# Patient Record
Sex: Female | Born: 1955 | Race: White | Hispanic: No | Marital: Married | Smoking: Former smoker
Health system: Southern US, Community
[De-identification: ages and names within clinical notes are randomized; demographics above are authoritative.]

## PROBLEM LIST (undated history)

## (undated) DIAGNOSIS — Z789 Other specified health status: Secondary | ICD-10-CM

## (undated) DIAGNOSIS — Z87442 Personal history of urinary calculi: Secondary | ICD-10-CM

## (undated) DIAGNOSIS — Z9889 Other specified postprocedural states: Secondary | ICD-10-CM

## (undated) DIAGNOSIS — J45909 Unspecified asthma, uncomplicated: Secondary | ICD-10-CM

## (undated) DIAGNOSIS — I10 Essential (primary) hypertension: Secondary | ICD-10-CM

## (undated) DIAGNOSIS — M1990 Unspecified osteoarthritis, unspecified site: Secondary | ICD-10-CM

## (undated) DIAGNOSIS — Z8489 Family history of other specified conditions: Secondary | ICD-10-CM

## (undated) DIAGNOSIS — R112 Nausea with vomiting, unspecified: Secondary | ICD-10-CM

## (undated) HISTORY — PX: KNEE ARTHROSCOPY: SUR90

## (undated) HISTORY — PX: ENDOMETRIAL ABLATION: SHX621

## (undated) HISTORY — PX: TONSILLECTOMY: SUR1361

## (undated) HISTORY — PX: CARPAL TUNNEL RELEASE: SHX101

## (undated) HISTORY — PX: ANKLE FRACTURE SURGERY: SHX122

## (undated) HISTORY — PX: OTHER SURGICAL HISTORY: SHX169

---

## 1999-11-15 ENCOUNTER — Other Ambulatory Visit: Admission: RE | Admit: 1999-11-15 | Discharge: 1999-11-15 | Payer: Self-pay | Admitting: Gynecology

## 1999-11-15 ENCOUNTER — Encounter: Admission: RE | Admit: 1999-11-15 | Discharge: 1999-11-15 | Payer: Self-pay | Admitting: Gynecology

## 1999-11-15 ENCOUNTER — Encounter: Payer: Self-pay | Admitting: Gynecology

## 2000-11-01 ENCOUNTER — Other Ambulatory Visit: Admission: RE | Admit: 2000-11-01 | Discharge: 2000-11-01 | Payer: Self-pay | Admitting: Gynecology

## 2000-11-15 ENCOUNTER — Encounter: Admission: RE | Admit: 2000-11-15 | Discharge: 2000-11-15 | Payer: Self-pay | Admitting: Gynecology

## 2000-11-15 ENCOUNTER — Encounter: Payer: Self-pay | Admitting: Gynecology

## 2001-11-19 ENCOUNTER — Encounter: Payer: Self-pay | Admitting: Gynecology

## 2001-11-19 ENCOUNTER — Encounter: Admission: RE | Admit: 2001-11-19 | Discharge: 2001-11-19 | Payer: Self-pay | Admitting: Gynecology

## 2001-11-19 ENCOUNTER — Other Ambulatory Visit: Admission: RE | Admit: 2001-11-19 | Discharge: 2001-11-19 | Payer: Self-pay | Admitting: Gynecology

## 2002-11-21 ENCOUNTER — Encounter: Admission: RE | Admit: 2002-11-21 | Discharge: 2002-11-21 | Payer: Self-pay | Admitting: Gynecology

## 2002-11-21 ENCOUNTER — Other Ambulatory Visit: Admission: RE | Admit: 2002-11-21 | Discharge: 2002-11-21 | Payer: Self-pay | Admitting: Gynecology

## 2002-11-21 ENCOUNTER — Encounter: Payer: Self-pay | Admitting: Gynecology

## 2003-11-26 ENCOUNTER — Other Ambulatory Visit: Admission: RE | Admit: 2003-11-26 | Discharge: 2003-11-26 | Payer: Self-pay | Admitting: Gynecology

## 2003-11-26 ENCOUNTER — Encounter: Admission: RE | Admit: 2003-11-26 | Discharge: 2003-11-26 | Payer: Self-pay | Admitting: Gynecology

## 2004-12-01 ENCOUNTER — Other Ambulatory Visit: Admission: RE | Admit: 2004-12-01 | Discharge: 2004-12-01 | Payer: Self-pay | Admitting: Gynecology

## 2004-12-01 ENCOUNTER — Encounter: Admission: RE | Admit: 2004-12-01 | Discharge: 2004-12-01 | Payer: Self-pay | Admitting: Gynecology

## 2006-01-27 ENCOUNTER — Other Ambulatory Visit: Admission: RE | Admit: 2006-01-27 | Discharge: 2006-01-27 | Payer: Self-pay | Admitting: Gynecology

## 2006-01-27 ENCOUNTER — Encounter: Admission: RE | Admit: 2006-01-27 | Discharge: 2006-01-27 | Payer: Self-pay | Admitting: Gynecology

## 2007-01-31 ENCOUNTER — Other Ambulatory Visit: Admission: RE | Admit: 2007-01-31 | Discharge: 2007-01-31 | Payer: Self-pay | Admitting: Gynecology

## 2007-01-31 ENCOUNTER — Encounter: Admission: RE | Admit: 2007-01-31 | Discharge: 2007-01-31 | Payer: Self-pay | Admitting: Gynecology

## 2008-04-21 ENCOUNTER — Encounter: Admission: RE | Admit: 2008-04-21 | Discharge: 2008-04-21 | Payer: Self-pay | Admitting: Gynecology

## 2009-04-22 ENCOUNTER — Encounter: Admission: RE | Admit: 2009-04-22 | Discharge: 2009-04-22 | Payer: Self-pay | Admitting: Gynecology

## 2009-08-01 HISTORY — PX: FRACTURE SURGERY: SHX138

## 2010-04-27 ENCOUNTER — Encounter: Admission: RE | Admit: 2010-04-27 | Discharge: 2010-04-27 | Payer: Self-pay | Admitting: Gynecology

## 2011-03-23 ENCOUNTER — Other Ambulatory Visit: Payer: Self-pay | Admitting: Gynecology

## 2011-03-23 DIAGNOSIS — Z1231 Encounter for screening mammogram for malignant neoplasm of breast: Secondary | ICD-10-CM

## 2011-05-02 ENCOUNTER — Ambulatory Visit
Admission: RE | Admit: 2011-05-02 | Discharge: 2011-05-02 | Disposition: A | Discharge status: Home / Self Care | Payer: BC Managed Care – PPO | Source: Ambulatory Visit | Attending: Gynecology | Admitting: Gynecology

## 2011-05-02 DIAGNOSIS — Z1231 Encounter for screening mammogram for malignant neoplasm of breast: Secondary | ICD-10-CM

## 2011-05-11 DIAGNOSIS — R072 Precordial pain: Secondary | ICD-10-CM

## 2012-04-11 ENCOUNTER — Other Ambulatory Visit: Payer: Self-pay | Admitting: Gynecology

## 2012-04-11 DIAGNOSIS — Z1231 Encounter for screening mammogram for malignant neoplasm of breast: Secondary | ICD-10-CM

## 2012-05-02 ENCOUNTER — Ambulatory Visit
Admission: RE | Admit: 2012-05-02 | Discharge: 2012-05-02 | Disposition: A | Discharge status: Home / Self Care | Payer: BC Managed Care – PPO | Source: Ambulatory Visit | Attending: Gynecology | Admitting: Gynecology

## 2012-05-02 DIAGNOSIS — Z1231 Encounter for screening mammogram for malignant neoplasm of breast: Secondary | ICD-10-CM

## 2013-04-30 ENCOUNTER — Other Ambulatory Visit: Payer: Self-pay

## 2013-04-30 DIAGNOSIS — Z1231 Encounter for screening mammogram for malignant neoplasm of breast: Secondary | ICD-10-CM

## 2013-05-14 ENCOUNTER — Encounter (HOSPITAL_COMMUNITY): Payer: Self-pay

## 2013-05-16 ENCOUNTER — Ambulatory Visit
Admission: RE | Admit: 2013-05-16 | Discharge: 2013-05-16 | Disposition: A | Discharge status: Home / Self Care | Payer: BC Managed Care – PPO | Source: Ambulatory Visit

## 2013-05-16 DIAGNOSIS — Z1231 Encounter for screening mammogram for malignant neoplasm of breast: Secondary | ICD-10-CM

## 2013-05-22 ENCOUNTER — Encounter (HOSPITAL_COMMUNITY)
Admission: RE | Admit: 2013-05-22 | Discharge: 2013-05-22 | Disposition: A | Discharge status: Home / Self Care | Payer: BC Managed Care – PPO | Source: Ambulatory Visit | Attending: Obstetrics and Gynecology | Admitting: Obstetrics and Gynecology

## 2013-05-22 ENCOUNTER — Encounter (HOSPITAL_COMMUNITY): Payer: Self-pay

## 2013-05-22 DIAGNOSIS — Z01812 Encounter for preprocedural laboratory examination: Secondary | ICD-10-CM | POA: Insufficient documentation

## 2013-05-22 HISTORY — DX: Nausea with vomiting, unspecified: R11.2

## 2013-05-22 HISTORY — DX: Other specified postprocedural states: Z98.890

## 2013-05-22 HISTORY — DX: Other specified health status: Z78.9

## 2013-05-22 LAB — COMPREHENSIVE METABOLIC PANEL
ALT: 25 U/L (ref 0–35)
Albumin: 3.8 g/dL (ref 3.5–5.2)
Alkaline Phosphatase: 81 U/L (ref 39–117)
Chloride: 104 mEq/L (ref 96–112)
GFR calc Af Amer: >90 mL/min (ref >90)
Glucose, Bld: 79 mg/dL (ref 70–99)
Potassium: 4.2 mEq/L (ref 3.5–5.1)
Sodium: 139 mEq/L (ref 135–145)
Total Protein: 6.8 g/dL (ref 6.0–8.3)

## 2013-05-22 LAB — CBC
Hemoglobin: 13.9 g/dL (ref 12.0–15.0)
MCHC: 33.7 g/dL (ref 30.0–36.0)
WBC: 4.5 10*3/uL (ref 4.0–10.5)

## 2013-05-22 NOTE — Patient Instructions (Signed)
19 Denise Steele  05/22/2013   Your procedure is scheduled on:  05/30/13  Enter through the Main Entrance of [facility] at 6 AM.  Pick up the phone at the desk and dial [phone].   Call this number if you have problems the morning of surgery: [phone]   Remember:   Do not eat food:After Midnight.  Do not drink clear liquids: After Midnight.  Take these medicines the morning of surgery with A SIP OF WATER: NA   Do not wear jewelry, make-up or nail polish.  Do not wear lotions, powders, or perfumes. You may wear deodorant.  Do not shave 48 hours prior to surgery.  Do not bring valuables to the hospital.  [facility] is not   responsible for any belongings or valuables brought to the hospital.  Contacts, dentures or bridgework may not be worn into surgery.  Leave suitcase in the car. After surgery it may be brought to your room.  For patients admitted to the hospital, checkout time is 11:00 AM the day of              discharge.   Patients discharged the day of surgery will not be allowed to drive             home.  Name and phone number of your driver: NA  Special Instructions:   Shower using CHG 2 nights before surgery and the night before surgery.  If you shower the day of surgery use CHG.  Use special wash - you have one bottle of CHG for all showers.  You should use approximately 1/3 of the bottle for each shower.   Please read over the following fact sheets that you were given:   Surgical Site Infection Prevention

## 2013-05-23 ENCOUNTER — Other Ambulatory Visit (HOSPITAL_COMMUNITY): Payer: Self-pay

## 2013-05-24 ENCOUNTER — Ambulatory Visit: Payer: Self-pay

## 2013-05-29 MED ORDER — GENTAMICIN SULFATE 40 MG/ML IJ SOLN
INTRAVENOUS | Status: AC
  Administered 2013-05-30: 100 mL via INTRAVENOUS
  Filled 2013-05-29: qty 8.75

## 2013-05-29 NOTE — H&P (Signed)
Denise Steele is an 57 y.o. female G1P1 who presents for a scheduled LAVH/BSO due to an ongoing issue with postmenopausal bleeding.  The patient was amenorrheic for 18 months then in 6/14 had a ten day heavy bleed with clots.  She had a benign EMB in 7/14 and an [place] which showed multiple small fibroids.  The endometrium is hard to assess due to a prior endometrial ablation/resection which did make her cycles much lighter until they stopped.  She uses Femring and cyclic progesterones, but does not tolerate provera well.  SHe had another bleeding episode in 8/14 and wishes definitive surgical therapy with concurrent removal of ovaries.    Pertinent Gynecological History: Last pap: normal Date: 2013 OB History: NSVD x1   Menstrual History:  No LMP recorded. Patient is postmenopausal.    Past Medical History  Diagnosis Date  . PONV (postoperative nausea and vomiting)   . Medical history non-contributory     Past Surgical History  Procedure Laterality Date  . Ankle fracture surgery    . Tonsillectomy    . Endometrial ablation      No family history on file.  Social History:  reports that she has quit smoking. She does not have any smokeless tobacco history on file. She reports that she drinks alcohol. She reports that she does not use illicit drugs.  Allergies:  Allergies  Allergen Reactions  . Penicillins Hives    Childhood rxn    No prescriptions prior to admission    ROS  Height 5\' 6"  (1.676 m), weight 83.915 kg (185 lb). Physical Exam  Constitutional: She is oriented to person, place, and time. She appears well-developed and well-nourished.  Cardiovascular: Normal rate and regular rhythm.   Respiratory: Effort normal and breath sounds normal.  GI: Soft.  Genitourinary: Vagina normal.  Uterus ULN size, irregular contour  Musculoskeletal: Normal range of motion.  Neurological: She is alert and oriented to person, place, and time.  Psychiatric: She has a normal mood and  affect. Her behavior is normal.    No results found for this or any previous visit (from the past 24 hour(s)).  No results found.  Assessment/Plan: The patient was counseled regarding the risks of laparoscopic assisted vaginal hysterectomy, The procedure was reviewed in detail and expectations regarding recovery. Risks of bleeding, infection and possible damage to bowel and bladder were reviewed. The patient understands that should a complication arise she would likely need a larger abdominal incision and that this would delay her recovery. She would accept a blood transfusion if needed. We also discussed removal of the fallopian tubes as a means of possibly reducing future risk of ovarian cancer and she is agreeable to this. She will also have her ovaries removed. She is ready to proceed.   Oliver Pila 05/29/2013, 9:30 PM

## 2013-05-30 ENCOUNTER — Ambulatory Visit (HOSPITAL_COMMUNITY): Payer: BC Managed Care – PPO | Admitting: Anesthesiology

## 2013-05-30 ENCOUNTER — Encounter (HOSPITAL_COMMUNITY)
Admission: RE | Disposition: A | Discharge status: Home / Self Care | Payer: Self-pay | Source: Ambulatory Visit | Attending: Obstetrics and Gynecology

## 2013-05-30 ENCOUNTER — Ambulatory Visit (HOSPITAL_COMMUNITY)
Admission: RE | Admit: 2013-05-30 | Discharge: 2013-05-31 | Disposition: A | Discharge status: Home / Self Care | Payer: BC Managed Care – PPO | Source: Ambulatory Visit | Attending: Obstetrics and Gynecology | Admitting: Obstetrics and Gynecology

## 2013-05-30 ENCOUNTER — Encounter (HOSPITAL_COMMUNITY): Payer: Self-pay | Admitting: *Deleted

## 2013-05-30 ENCOUNTER — Encounter (HOSPITAL_COMMUNITY): Payer: BC Managed Care – PPO | Admitting: Anesthesiology

## 2013-05-30 DIAGNOSIS — N9489 Other specified conditions associated with female genital organs and menstrual cycle: Secondary | ICD-10-CM | POA: Insufficient documentation

## 2013-05-30 DIAGNOSIS — D279 Benign neoplasm of unspecified ovary: Secondary | ICD-10-CM | POA: Insufficient documentation

## 2013-05-30 DIAGNOSIS — N950 Postmenopausal bleeding: Secondary | ICD-10-CM | POA: Insufficient documentation

## 2013-05-30 DIAGNOSIS — N72 Inflammatory disease of cervix uteri: Secondary | ICD-10-CM | POA: Insufficient documentation

## 2013-05-30 DIAGNOSIS — D251 Intramural leiomyoma of uterus: Secondary | ICD-10-CM | POA: Insufficient documentation

## 2013-05-30 DIAGNOSIS — Z90710 Acquired absence of both cervix and uterus: Secondary | ICD-10-CM

## 2013-05-30 HISTORY — PX: SALPINGOOPHORECTOMY: SHX82

## 2013-05-30 HISTORY — PX: CYSTOSCOPY: SHX5120

## 2013-05-30 HISTORY — PX: LAPAROSCOPIC ASSISTED VAGINAL HYSTERECTOMY: SHX5398

## 2013-05-30 LAB — TYPE AND SCREEN: ABO/RH(D): AB POS

## 2013-05-30 SURGERY — HYSTERECTOMY, VAGINAL, LAPAROSCOPY-ASSISTED
Anesthesia: General | Site: Vagina | Wound class: Clean Contaminated

## 2013-05-30 MED ORDER — PROPOFOL 10 MG/ML IV BOLUS
INTRAVENOUS | Status: DC | PRN
  Administered 2013-05-30: 200 mg via INTRAVENOUS

## 2013-05-30 MED ORDER — DIPHENHYDRAMINE HCL 50 MG/ML IJ SOLN: 12.5000 mg | Freq: Four times a day (QID) | INTRAMUSCULAR | Status: DC | PRN

## 2013-05-30 MED ORDER — LACTATED RINGERS IV SOLN: INTRAVENOUS | Status: DC

## 2013-05-30 MED ORDER — OXYCODONE-ACETAMINOPHEN 5-325 MG PO TABS
1.0000 | ORAL_TABLET | ORAL | Status: DC | PRN
  Administered 2013-05-31: 2 via ORAL
  Filled 2013-05-30: qty 2

## 2013-05-30 MED ORDER — NEOSTIGMINE METHYLSULFATE 1 MG/ML IJ SOLN
INTRAMUSCULAR | Status: AC
  Filled 2013-05-30: qty 1

## 2013-05-30 MED ORDER — DIPHENHYDRAMINE HCL 12.5 MG/5ML PO ELIX
12.5000 mg | ORAL_SOLUTION | Freq: Four times a day (QID) | ORAL | Status: DC | PRN
  Filled 2013-05-30: qty 5

## 2013-05-30 MED ORDER — MIDAZOLAM HCL 5 MG/5ML IJ SOLN
INTRAMUSCULAR | Status: DC | PRN
  Administered 2013-05-30: 2 mg via INTRAVENOUS

## 2013-05-30 MED ORDER — PROMETHAZINE HCL 25 MG/ML IJ SOLN
6.2500 mg | INTRAMUSCULAR | Status: DC | PRN
  Administered 2013-05-30: 12.5 mg via INTRAVENOUS

## 2013-05-30 MED ORDER — LACTATED RINGERS IV SOLN
INTRAVENOUS | Status: DC
  Administered 2013-05-30 (×3): via INTRAVENOUS
  Administered 2013-05-30: 125 mL/h via INTRAVENOUS

## 2013-05-30 MED ORDER — PROPOFOL 10 MG/ML IV EMUL
INTRAVENOUS | Status: AC
  Filled 2013-05-30: qty 20

## 2013-05-30 MED ORDER — HYDROMORPHONE 0.3 MG/ML IV SOLN
INTRAVENOUS | Status: DC
  Administered 2013-05-30: 7.33 mg via INTRAVENOUS
  Administered 2013-05-30: 2.52 mg via INTRAVENOUS
  Administered 2013-05-30: 14:00:00 via INTRAVENOUS
  Administered 2013-05-31: 1.19 mg via INTRAVENOUS
  Administered 2013-05-31: 1.59 mg via INTRAVENOUS
  Filled 2013-05-30: qty 25

## 2013-05-30 MED ORDER — KETOROLAC TROMETHAMINE 30 MG/ML IJ SOLN: 15.0000 mg | Freq: Once | INTRAMUSCULAR | Status: DC | PRN

## 2013-05-30 MED ORDER — BUPIVACAINE HCL (PF) 0.25 % IJ SOLN
INTRAMUSCULAR | Status: AC
  Filled 2013-05-30: qty 30

## 2013-05-30 MED ORDER — ONDANSETRON HCL 4 MG/2ML IJ SOLN: 4.0000 mg | Freq: Four times a day (QID) | INTRAMUSCULAR | Status: DC | PRN

## 2013-05-30 MED ORDER — FENTANYL CITRATE 0.05 MG/ML IJ SOLN
25.0000 ug | INTRAMUSCULAR | Status: DC | PRN
  Administered 2013-05-30 (×2): 50 ug via INTRAVENOUS

## 2013-05-30 MED ORDER — NEOSTIGMINE METHYLSULFATE 1 MG/ML IJ SOLN
INTRAMUSCULAR | Status: DC | PRN
  Administered 2013-05-30: 3 mg via INTRAVENOUS

## 2013-05-30 MED ORDER — KETOROLAC TROMETHAMINE 30 MG/ML IJ SOLN
INTRAMUSCULAR | Status: DC | PRN
  Administered 2013-05-30: 30 mg via INTRAVENOUS

## 2013-05-30 MED ORDER — LIDOCAINE HCL (CARDIAC) 20 MG/ML IV SOLN
INTRAVENOUS | Status: AC
  Filled 2013-05-30: qty 5

## 2013-05-30 MED ORDER — HYDROMORPHONE HCL PF 1 MG/ML IJ SOLN
INTRAMUSCULAR | Status: DC | PRN
  Administered 2013-05-30: 1 mg via INTRAVENOUS

## 2013-05-30 MED ORDER — GLYCOPYRROLATE 0.2 MG/ML IJ SOLN
INTRAMUSCULAR | Status: DC | PRN
  Administered 2013-05-30: 0.4 mg via INTRAVENOUS

## 2013-05-30 MED ORDER — DEXAMETHASONE SODIUM PHOSPHATE 10 MG/ML IJ SOLN
INTRAMUSCULAR | Status: AC
  Filled 2013-05-30: qty 1

## 2013-05-30 MED ORDER — FENTANYL CITRATE 0.05 MG/ML IJ SOLN
INTRAMUSCULAR | Status: AC
  Filled 2013-05-30: qty 5

## 2013-05-30 MED ORDER — LIDOCAINE HCL (CARDIAC) 20 MG/ML IV SOLN
INTRAVENOUS | Status: DC | PRN
  Administered 2013-05-30: 50 mg via INTRAVENOUS

## 2013-05-30 MED ORDER — DOCUSATE SODIUM 100 MG PO CAPS
100.0000 mg | ORAL_CAPSULE | Freq: Two times a day (BID) | ORAL | Status: DC
  Administered 2013-05-30 – 2013-05-31 (×2): 100 mg via ORAL
  Filled 2013-05-30 (×6): qty 1

## 2013-05-30 MED ORDER — ONDANSETRON HCL 4 MG/2ML IJ SOLN
INTRAMUSCULAR | Status: AC
  Filled 2013-05-30: qty 2

## 2013-05-30 MED ORDER — INDIGOTINDISULFONATE SODIUM 8 MG/ML IJ SOLN
INTRAMUSCULAR | Status: AC
  Filled 2013-05-30: qty 5

## 2013-05-30 MED ORDER — MIDAZOLAM HCL 2 MG/2ML IJ SOLN
INTRAMUSCULAR | Status: AC
  Filled 2013-05-30: qty 2

## 2013-05-30 MED ORDER — PROMETHAZINE HCL 25 MG/ML IJ SOLN
INTRAMUSCULAR | Status: AC
  Filled 2013-05-30: qty 1

## 2013-05-30 MED ORDER — FENTANYL CITRATE 0.05 MG/ML IJ SOLN
INTRAMUSCULAR | Status: AC
  Administered 2013-05-30: 50 ug via INTRAVENOUS
  Filled 2013-05-30: qty 2

## 2013-05-30 MED ORDER — ROCURONIUM BROMIDE 100 MG/10ML IV SOLN
INTRAVENOUS | Status: AC
  Filled 2013-05-30: qty 1

## 2013-05-30 MED ORDER — MEPERIDINE HCL 25 MG/ML IJ SOLN: 6.2500 mg | INTRAMUSCULAR | Status: DC | PRN

## 2013-05-30 MED ORDER — FENTANYL CITRATE 0.05 MG/ML IJ SOLN
INTRAMUSCULAR | Status: DC | PRN
  Administered 2013-05-30 (×7): 50 ug via INTRAVENOUS
  Administered 2013-05-30: 150 ug via INTRAVENOUS

## 2013-05-30 MED ORDER — IBUPROFEN 600 MG PO TABS
600.0000 mg | ORAL_TABLET | Freq: Four times a day (QID) | ORAL | Status: DC | PRN
  Administered 2013-05-31: 600 mg via ORAL
  Filled 2013-05-30: qty 1

## 2013-05-30 MED ORDER — SODIUM CHLORIDE 0.9 % IJ SOLN
INTRAMUSCULAR | Status: AC
  Filled 2013-05-30: qty 10

## 2013-05-30 MED ORDER — VASOPRESSIN 20 UNIT/ML IJ SOLN
INTRAMUSCULAR | Status: AC
  Filled 2013-05-30: qty 1

## 2013-05-30 MED ORDER — NALOXONE HCL 0.4 MG/ML IJ SOLN: 0.4000 mg | INTRAMUSCULAR | Status: DC | PRN

## 2013-05-30 MED ORDER — DEXAMETHASONE SODIUM PHOSPHATE 10 MG/ML IJ SOLN
INTRAMUSCULAR | Status: DC | PRN
  Administered 2013-05-30: 10 mg via INTRAVENOUS

## 2013-05-30 MED ORDER — SODIUM CHLORIDE 0.9 % IJ SOLN: 9.0000 mL | INTRAMUSCULAR | Status: DC | PRN

## 2013-05-30 MED ORDER — BUPIVACAINE HCL (PF) 0.25 % IJ SOLN
INTRAMUSCULAR | Status: DC | PRN
  Administered 2013-05-30: 10 mL

## 2013-05-30 MED ORDER — GLYCOPYRROLATE 0.2 MG/ML IJ SOLN
INTRAMUSCULAR | Status: AC
  Filled 2013-05-30: qty 2

## 2013-05-30 MED ORDER — MIDAZOLAM HCL 2 MG/2ML IJ SOLN: 0.5000 mg | Freq: Once | INTRAMUSCULAR | Status: DC | PRN

## 2013-05-30 MED ORDER — LACTATED RINGERS IV SOLN
INTRAVENOUS | Status: DC
  Administered 2013-05-30: 22:00:00 via INTRAVENOUS

## 2013-05-30 MED ORDER — KETOROLAC TROMETHAMINE 30 MG/ML IJ SOLN
INTRAMUSCULAR | Status: AC
  Filled 2013-05-30: qty 1

## 2013-05-30 MED ORDER — HYDROMORPHONE HCL PF 1 MG/ML IJ SOLN
INTRAMUSCULAR | Status: AC
  Filled 2013-05-30: qty 1

## 2013-05-30 MED ORDER — ROCURONIUM BROMIDE 100 MG/10ML IV SOLN
INTRAVENOUS | Status: DC | PRN
  Administered 2013-05-30: 10 mg via INTRAVENOUS
  Administered 2013-05-30: 60 mg via INTRAVENOUS

## 2013-05-30 MED ORDER — SIMETHICONE 80 MG PO CHEW
80.0000 mg | CHEWABLE_TABLET | Freq: Four times a day (QID) | ORAL | Status: DC | PRN
  Filled 2013-05-30: qty 1

## 2013-05-30 MED ORDER — ESTRADIOL 0.1 MG/GM VA CREA
TOPICAL_CREAM | VAGINAL | Status: AC
  Filled 2013-05-30: qty 42.5

## 2013-05-30 MED ORDER — ONDANSETRON HCL 4 MG/2ML IJ SOLN
INTRAMUSCULAR | Status: DC | PRN
  Administered 2013-05-30: 4 mg via INTRAVENOUS

## 2013-05-30 MED ORDER — INDIGOTINDISULFONATE SODIUM 8 MG/ML IJ SOLN
INTRAMUSCULAR | Status: DC | PRN
  Administered 2013-05-30: 5 mL via INTRAVENOUS

## 2013-05-30 SURGICAL SUPPLY — 43 items
ADH SKN CLS APL DERMABOND .7 (GAUZE/BANDAGES/DRESSINGS) ×2
CABLE HIGH FREQUENCY MONO STRZ (ELECTRODE) IMPLANT
CATH ROBINSON RED A/P 16FR (CATHETERS) IMPLANT
CHLORAPREP W/TINT 26ML (MISCELLANEOUS) ×4 IMPLANT
CLOTH BEACON ORANGE TIMEOUT ST (SAFETY) ×4 IMPLANT
CONT PATH 16OZ SNAP LID 3702 (MISCELLANEOUS) ×4 IMPLANT
COVER TABLE BACK 60X90 (DRAPES) ×4 IMPLANT
DECANTER SPIKE VIAL GLASS SM (MISCELLANEOUS) ×4 IMPLANT
DERMABOND ADVANCED (GAUZE/BANDAGES/DRESSINGS) ×1
DERMABOND ADVANCED .7 DNX12 (GAUZE/BANDAGES/DRESSINGS) ×3 IMPLANT
ELECT REM PT RETURN 9FT ADLT (ELECTROSURGICAL)
ELECTRODE REM PT RTRN 9FT ADLT (ELECTROSURGICAL) IMPLANT
GLOVE BIOGEL PI IND STRL 6.5 (GLOVE) ×9 IMPLANT
GLOVE BIOGEL PI IND STRL 7.0 (GLOVE) ×9 IMPLANT
GLOVE BIOGEL PI INDICATOR 6.5 (GLOVE) ×3
GLOVE BIOGEL PI INDICATOR 7.0 (GLOVE) ×3
GLOVE ORTHO TXT STRL SZ7.5 (GLOVE) ×20 IMPLANT
GOWN STRL REIN XL XLG (GOWN DISPOSABLE) ×20 IMPLANT
NEEDLE INSUFFLATION 120MM (ENDOMECHANICALS) ×4 IMPLANT
NS IRRIG 1000ML POUR BTL (IV SOLUTION) ×4 IMPLANT
PACK LAVH (CUSTOM PROCEDURE TRAY) ×4 IMPLANT
PROTECTOR NERVE ULNAR (MISCELLANEOUS) ×4 IMPLANT
SCALPEL HARMONIC ACE (MISCELLANEOUS) ×4 IMPLANT
SET CYSTO W/LG BORE CLAMP LF (SET/KITS/TRAYS/PACK) ×4 IMPLANT
SET IRRIG TUBING LAPAROSCOPIC (IRRIGATION / IRRIGATOR) ×4 IMPLANT
STRIP CLOSURE SKIN 1/4X3 (GAUZE/BANDAGES/DRESSINGS) IMPLANT
SUT SILK 0 FSL (SUTURE) ×4 IMPLANT
SUT VIC AB 0 CT1 18XCR BRD8 (SUTURE) ×9 IMPLANT
SUT VIC AB 0 CT1 27 (SUTURE) ×4
SUT VIC AB 0 CT1 27XBRD ANBCTR (SUTURE) ×3 IMPLANT
SUT VIC AB 0 CT1 8-18 (SUTURE) ×6
SUT VIC AB 2-0 CT1 (SUTURE) ×4 IMPLANT
SUT VIC AB 2-0 CT1 27 (SUTURE) ×8
SUT VIC AB 2-0 CT1 TAPERPNT 27 (SUTURE) ×6 IMPLANT
SUT VICRYL 0 TIES 12 18 (SUTURE) IMPLANT
SUT VICRYL 0 UR6 27IN ABS (SUTURE) IMPLANT
SUT VICRYL 4-0 PS2 18IN ABS (SUTURE) ×4 IMPLANT
TOWEL OR 17X24 6PK STRL BLUE (TOWEL DISPOSABLE) ×8 IMPLANT
TRAY FOLEY CATH 14FR (SET/KITS/TRAYS/PACK) ×4 IMPLANT
TROCAR XCEL NON-BLD 5MMX100MML (ENDOMECHANICALS) ×4 IMPLANT
TROCAR XCEL OPT SLVE 5M 100M (ENDOMECHANICALS) ×8 IMPLANT
WARMER LAPAROSCOPE (MISCELLANEOUS) ×4 IMPLANT
WATER STERILE IRR 1000ML POUR (IV SOLUTION) ×4 IMPLANT

## 2013-05-30 NOTE — Brief Op Note (Signed)
05/30/2013  10:45 AM  PATIENT:  Denise Steele  57 y.o. female  PRE-OPERATIVE DIAGNOSIS:  History of abnormal uterine bleeding, 19147  POST-OPERATIVE DIAGNOSIS:  History of abnormal uterine bleeding  PROCEDURE:  Procedure(s) with comments: LAPAROSCOPIC ASSISTED VAGINAL HYSTERECTOMY (N/A) - abdomen and vagins SALPINGO OOPHORECTOMY (N/A) CYSTOSCOPY (N/A)  SURGEON:  Surgeon(s) and Role:    * Oliver Pila, MD - Primary    * Lavina Hamman, MD - Assisting    ANESTHESIA:   local and general  EBL:  Total I/O In: 2200 [I.V.:2200] Out: 600 [Urine:200; Blood:400]  BLOOD ADMINISTERED:none  DRAINS: Urinary Catheter (Foley)   LOCAL MEDICATIONS USED:  MARCAINE     SPECIMEN: morcellated uterus and cervix and tubes with ovaries  DISPOSITION OF SPECIMEN:  PATHOLOGY  COUNTS:  YES  TOURNIQUET:  * No tourniquets in log *  DICTATION: .Dragon Dictation  PLAN OF CARE: Admit for overnight observation  PATIENT DISPOSITION:  PACU - hemodynamically stable.

## 2013-05-30 NOTE — Progress Notes (Signed)
Patient ID: Denise Steele, female   DOB: 07-19-1956, 57 y.o.   MRN: [id number]  Got sick in PACU.  Feeling better now.  Some cramping, but doing ok, getting ready to get up.   AFVSS, good uop gen NAD CV RRR Lungs CTAB Abd soft, app tender, + BS Inc C/D/I Ext SCDs in place  57yo s/p LAVH/BSO POD #0 doing well.  Routine care.

## 2013-05-30 NOTE — Anesthesia Preprocedure Evaluation (Signed)
Anesthesia Evaluation  Patient identified by MRN, date of birth, ID band Patient awake    Reviewed: Allergy & Precautions, H&P , Patient's Chart, lab work & pertinent test results, reviewed documented beta blocker date and time   History of Anesthesia Complications (+) PONV and history of anesthetic complications  Airway Mallampati: II TM Distance: >3 FB Neck ROM: full    Dental no notable dental hx.    Pulmonary  breath sounds clear to auscultation  Pulmonary exam normal       Cardiovascular Rhythm:regular Rate:Normal     Neuro/Psych    GI/Hepatic   Endo/Other    Renal/GU      Musculoskeletal   Abdominal   Peds  Hematology   Anesthesia Other Findings   Reproductive/Obstetrics                           Anesthesia Physical Anesthesia Plan  ASA: II  Anesthesia Plan: General   Post-op Pain Management:    Induction: Intravenous  Airway Management Planned: Oral ETT  Additional Equipment:   Intra-op Plan:   Post-operative Plan: Extubation in OR  Informed Consent: I have reviewed the patients History and Physical, chart, labs and discussed the procedure including the risks, benefits and alternatives for the proposed anesthesia with the patient or authorized representative who has indicated his/her understanding and acceptance.   Dental Advisory Given and Dental advisory given  Plan Discussed with: CRNA and Surgeon  Anesthesia Plan Comments: (  Discussed general anesthesia, including possible nausea, instrumentation of airway, sore throat,pulmonary aspiration, etc. I asked if the were any outstanding questions, or  concerns before we proceeded. )        Anesthesia Quick Evaluation

## 2013-05-30 NOTE — Anesthesia Postprocedure Evaluation (Signed)
  Anesthesia Post-op Note  Patient: Denise Steele  Procedure(s) Performed: Procedure(s) with comments: LAPAROSCOPIC ASSISTED VAGINAL HYSTERECTOMY (N/A) - abdomen and vagins SALPINGO OOPHORECTOMY (N/A) CYSTOSCOPY (N/A)  Patient Location: [facility]  Anesthesia Type:General  Level of Consciousness: awake, alert , oriented and patient cooperative  Airway and Oxygen Therapy: Patient Spontanous Breathing and Patient connected to nasal cannula oxygen  Post-op Pain: moderate;patient receiving pain medication for breakthrough pain at this time from floor RN  Post-op Assessment: Patient's Cardiovascular Status Stable, Respiratory Function Stable, Patent Airway, No signs of Nausea or vomiting, Adequate PO intake and Pain level controlled  Post-op Vital Signs: stable  Complications: No apparent anesthesia complications

## 2013-05-30 NOTE — Anesthesia Postprocedure Evaluation (Signed)
Anesthesia Post Note  Patient: Denise Steele  Procedure(s) Performed: Procedure(s) (LRB): LAPAROSCOPIC ASSISTED VAGINAL HYSTERECTOMY (N/A) SALPINGO OOPHORECTOMY (N/A) CYSTOSCOPY (N/A)  Anesthesia type: General  Patient location: PACU  Post pain: Pain level controlled  Post assessment: Post-op Vital signs reviewed  Last Vitals:  Filed Vitals:   05/30/13 1115  BP: 130/55  Pulse: 61  Temp:   Resp: 12    Post vital signs: Reviewed  Level of consciousness: sedated  Complications: No apparent anesthesia complications

## 2013-05-30 NOTE — Preoperative (Signed)
Beta Blockers   Reason not to administer Beta Blockers:Not Applicable 

## 2013-05-30 NOTE — Op Note (Signed)
Operative note  Preoperative diagnosis Postmenopausal bleeding Fibroid uterus Prior history of endometrial ablation  Postoperative diagnosis Same with dense scarring of the posterior cul-de-sac  Procedure Laparoscopic-assisted vaginal hysterectomy bilateral salpingo-oophorectomy and dissection of posterior cul-de-sac space Cystoscopy   Surgeon Dr. Huel Cote Dr. Tawanna Cooler Meisinger  Anesthesia Gen. Local anesthetic block at port sites  Findings Uterus was enlarged to 10-12 week size with multiple fibroids. The tubes appeared normal bilaterally the ovaries were adherent to the posterior abdominal wall and uterine fundus. The posterior cul-de-sac was essentially nonexistent with the peritoneal space beginning at the uterine fundus.  Fluids Estimated blood loss 500 cc Urine output 200 cc slightly blood-tinged urine IV fluids 2200 cc LR  Pathology Uterus ovaries and tubes were sent as specimen, uterus was morcellated to remove from the vagina  Procedure note Patient was taken to the operating room where general anesthesia was obtained without difficulty. She was prepped and draped in the normal sterile fashion in the dorsal lithotomy position. An appropriate time out was performed. A small infraumbilical incision was made in the umbilicus after injection with quarter percent Marcaine. The varies needle was easily introduced and intraperitoneal placement confirmed with injection aspiration of normal saline. Pneumoperitoneum was obtained with approximately 2 and half liters of CO2 gas The 5 mm Optiview trocar was then utilized to enter the peritoneal cavity under direct visualization.  2 other ports were then placed under direct visualization 5 mm in size in the upper lateral quadrants. Attention was then turned to the pelvic area where the left fallopian tube and ovary were reflected medially with a atraumatic grasper. The ovary itself was somewhat densely adherent to the posterior  pelvic wall and had to be dissected off with harmonic scalpel. Once this was free the remainder of the broad ligament and round ligament were taken down with harmonic scalpel. The bladder flap was developed with the harmonic scalpel as well. Attention was then turned to the right fallopian tube and ovary which in a similar fashion was reflected medially and the infant did below pelvic ligament round ligament and round ligament all taken down with harmonic scalpel. The bladder flap was developed to meet the prior area in the midline and this was pushed away from the lower uterine segment. All instruments and sponges were then removed from the port sites and the ports left in place and covered with a sterile drape. Attention was turned vaginally where a small weighted speculum was placed within the vagina and the cervix grasped with 2 Jacobson tenaculums. There was still only fair descensus noted despite the upper dissection. A circumferential incision was made around the cervix with Bovie cautery and further developed with Mayo scissors. The vaginal mucosa was then dissected off the cervix and an attempt made to enter the anterior cul-de-sac which could not quite be entered but was dissected off the cervix. Posteriorly the Mayo scissors were used to begin the dissection along the cervix and attempts were made to enter the posterior cul-de-sac however it was quite adherent and there was no obvious plain to do so. The uterosacral ligaments were then taken down bilaterally and a Zeppelin clamp transected and suture ligated with 0 Vicryl. A very slow but meticulous dissection was then begun along the lateral portions of the uterus with small bites taken with Zeppelin clamps and suture ligated with 0 Vicryl. E. then she'll either the anterior cul-de-sac was identified and entered but was somewhat distorted as the entire uterus was deviated to the patient's right.  The remainder of the broad and paracervical ligaments were  taken down with Zeppelin clamps on the right and told that side was completely free. There was still no obvious posterior cul-de-sac to enter and the tissue was still densely adherent there. The uterus was then slowly delivered forward and morcellated within the serosal edges to allow for delivery from the vagina.  Once the anterior portion of the uterus could be delivered from the vagina the remaining adhesions posteriorly could be better visualized. These were taken down and Zeppelin clamps transected and suture ligated with 0 Vicryl. And the uterus was completely removed and handed off to pathology. There were some areas of bleeding on the pedicles to the right which was controlled with a suture ligature of 0 Vicryl the posterior cuff was also bleeding in the space between that and the peritoneal cavity which was quite long was were all and oozing. This potential space was closed with a running locked suture of 2-0 Vicryl. At this point all pedicles appeared hemostatic as well as the posterior portion of the vaginal cuff. All instruments and sponges were removed from the abdomen and the vaginal cuff was closed in 2 layers the first a running locked layer of the deeper tissue that was oozing. The second was a layered closing the vaginal mucosa completely. At the conclusion of the closure there is no active bleeding noted. While working to enter the anterior space had been noted there was a pink tinge to the urine in the Foley catheter. Dr. Tawanna Cooler Meisinger therefore performed a cystoscopy and the uterine the anterior of the bladder as well as both ureteral jets and all appeared normal. Indigo carmine had been given during the case and there was no extravasation of blue dye into the vaginal area while we were working were into the abdominal cavity. Gloves were changed and attention was then turned to the abdomen were ports were still in place. Pneumoperitoneum was reobtained and the cuff and pelvis were copiously  irrigated.  There was no active bleeding noted aside for a slight amount of oozing down around the posterior peritoneum. This was cauterized with the harmonic and minimal bleeding noted. All other areas appeared well the pneumoperitoneum was then reduced and with a low pressure no active bleeding ensued. The lateral ports were then removed under direct visualization and the pneumoperitoneum reduced through the umbilical port. Finally with the umbilical port removed all port sites were closed with a 3-0 Vicryl suture in a subcuticular stitch and Dermabond. All instruments and sponge counts were correct and the patient was awakened and taken to the PACU in good condition.

## 2013-05-30 NOTE — Progress Notes (Signed)
Patient ID: Denise Steele, female   DOB: 04-Apr-1956, 57 y.o.   MRN: [id number] Per pt no changes in dictated H&P.  Brief exam WNL.  Ready to proceed.

## 2013-05-30 NOTE — OR Nursing (Signed)
Family updated regarding surgery progress at 70. Denise Steele, [place] 05/30/2013

## 2013-05-30 NOTE — Transfer of Care (Signed)
Immediate Anesthesia Transfer of Care Note  Patient: Denise Steele  Procedure(s) Performed: Procedure(s) with comments: LAPAROSCOPIC ASSISTED VAGINAL HYSTERECTOMY (N/A) - abdomen and vagins SALPINGO OOPHORECTOMY (N/A) CYSTOSCOPY (N/A)  Patient Location: PACU  Anesthesia Type:General  Level of Consciousness: awake, alert  and oriented  Airway & Oxygen Therapy: Patient Spontanous Breathing and Patient connected to nasal cannula oxygen  Post-op Assessment: Report given to PACU RN  Post vital signs: Reviewed  Complications: No apparent anesthesia complications

## 2013-05-31 ENCOUNTER — Encounter (HOSPITAL_COMMUNITY): Payer: Self-pay | Admitting: Obstetrics and Gynecology

## 2013-05-31 LAB — CBC
Hemoglobin: 11.3 g/dL — ABNORMAL LOW (ref 12.0–15.0)
MCH: 30.2 pg (ref 26.0–34.0)
MCV: 90.9 fL (ref 78.0–100.0)
Platelets: 194 10*3/uL (ref 150–400)
RBC: 3.74 MIL/uL — ABNORMAL LOW (ref 3.87–5.11)

## 2013-05-31 LAB — BASIC METABOLIC PANEL
CO2: 29 mEq/L (ref 19–32)
Calcium: 8.6 mg/dL (ref 8.4–10.5)
GFR calc non Af Amer: >90 mL/min (ref >90)
Glucose, Bld: 104 mg/dL — ABNORMAL HIGH (ref 70–99)
Potassium: 4.8 mEq/L (ref 3.5–5.1)
Sodium: 137 mEq/L (ref 135–145)

## 2013-05-31 MED ORDER — DIPHENHYDRAMINE HCL 25 MG PO CAPS
25.0000 mg | ORAL_CAPSULE | Freq: Once | ORAL | Status: AC
  Administered 2013-05-31: 25 mg via ORAL
  Filled 2013-05-31: qty 1

## 2013-05-31 MED ORDER — IBUPROFEN 600 MG PO TABS: 600.0000 mg | ORAL_TABLET | Freq: Four times a day (QID) | ORAL | Status: DC | PRN

## 2013-05-31 MED ORDER — OXYCODONE-ACETAMINOPHEN 5-325 MG PO TABS: 1.0000 | ORAL_TABLET | ORAL | Status: DC | PRN

## 2013-05-31 MED ORDER — ESTRADIOL 0.1 MG/24HR TD PTTW: 1.0000 | MEDICATED_PATCH | TRANSDERMAL | Status: DC

## 2013-05-31 NOTE — Discharge Summary (Signed)
Physician Discharge Summary  Patient ID: Ivette Castronova MRN: [id number] DOB/AGE: September 17, 1955 57 y.o.  Admit date: 05/30/2013 Discharge date: 05/31/2013  Admission Diagnoses: Abnormal uterine bleeding                                         Fibroids  Discharge Diagnoses: same   Discharged Condition: good  Hospital Course: Pt admitted to overnight observation s/p LAVH/BSO with a normal postoperative course. On d/c she was ambulating well, voiding without problem and tolerating po meds.   Significant Diagnostic Studies: intraoperative cystoscopy  Treatments: surgery: LAVH/BSO lysis of adhesions  Discharge Exam: Blood pressure 116/78, pulse 77, temperature 98 F (36.7 C), temperature source Oral, resp. rate 18, height 5\' 6"  (1.676 m), weight 83.915 kg (185 lb), SpO2 99.00%. General appearance: alert and cooperative GI: soft NT Incision/Wound:clear with no erythema and well approximated  Disposition: home  Discharge Orders   Future Orders Complete By Expires   Diet - low sodium heart healthy  As directed    Discharge instructions  As directed    Comments:     Avoid driving for at least 1-2 weeks or until off narcotic pain meds. No heavy lifting greater than 10 lbs Nothing in vagina for 6 weeks Shower over incision and pat dry   Increase activity slowly  As directed        Medication List    STOP taking these medications       FEMRING 0.05 MG/24HR Ring  Generic drug:  Estradiol Acetate      TAKE these medications       aspirin 81 MG chewable tablet  Chew 81 mg by mouth daily.     Biotin 1000 MCG tablet  Take 1,000 mcg by mouth daily.     estradiol 0.1 MG/24HR patch  Commonly known as:  VIVELLE-DOT  Place 1 patch (0.1 mg total) onto the skin 2 (two) times a week.     ibuprofen 600 MG tablet  Commonly known as:  ADVIL,MOTRIN  Take 1 tablet (600 mg total) by mouth every 6 (six) hours as needed (mild pain).     OCUVITE PO  Take 1 tablet by mouth daily.     oxyCODONE-acetaminophen 5-325 MG per tablet  Commonly known as:  PERCOCET/ROXICET  Take 1-2 tablets by mouth every 4 (four) hours as needed.     VITAMIN D PO  Take 1 tablet by mouth daily.           Follow-up Information   Follow up with Oliver Pila, MD. Schedule an appointment as soon as possible for a visit in 2 weeks. (incision check)    Specialty:  Obstetrics and Gynecology   Contact information:   [address], [address] [place] [address] [phone]       Signed: Oliver Pila 05/31/2013, 9:16 AM

## 2013-05-31 NOTE — Progress Notes (Signed)
Pt discharged home with husband. Condition stable. Pt ambulated to car with RN. No equipment for home ordered at discharge.

## 2013-05-31 NOTE — Progress Notes (Signed)
1 Day Post-Op Procedure(s) (LRB): LAPAROSCOPIC ASSISTED VAGINAL HYSTERECTOMY (N/A) SALPINGO OOPHORECTOMY (N/A) CYSTOSCOPY (N/A)  Subjective: Patient reports tolerating PO and no problems voiding.  Ambulating well.  Objective: I have reviewed patient's vital signs, intake and output and labs.  General: alert and cooperative GI: soft, non-tender; bowel sounds normal; no masses,  no organomegaly Incision C/D/I  Assessment: s/p Procedure(s) with comments: LAPAROSCOPIC ASSISTED VAGINAL HYSTERECTOMY (N/A) - abdomen and vagins SALPINGO OOPHORECTOMY (N/A) CYSTOSCOPY (N/A): progressing well and tolerating diet  Plan: Advance to PO medication Plan d/c this PM  LOS: 1 day    Harleen Fineberg W 05/31/2013, 9:11 AM

## 2016-07-20 ENCOUNTER — Other Ambulatory Visit: Payer: Self-pay | Admitting: Obstetrics and Gynecology

## 2016-07-20 DIAGNOSIS — E559 Vitamin D deficiency, unspecified: Secondary | ICD-10-CM

## 2016-08-04 ENCOUNTER — Ambulatory Visit
Admission: RE | Admit: 2016-08-04 | Discharge: 2016-08-04 | Disposition: A | Discharge status: Home / Self Care | Payer: BC Managed Care – PPO | Source: Ambulatory Visit | Attending: Obstetrics and Gynecology | Admitting: Obstetrics and Gynecology

## 2016-08-04 DIAGNOSIS — E559 Vitamin D deficiency, unspecified: Secondary | ICD-10-CM

## 2017-08-29 ENCOUNTER — Other Ambulatory Visit (HOSPITAL_COMMUNITY): Payer: Self-pay | Admitting: Dermatology

## 2017-08-29 ENCOUNTER — Ambulatory Visit (HOSPITAL_COMMUNITY)
Admission: RE | Admit: 2017-08-29 | Discharge: 2017-08-29 | Disposition: A | Discharge status: Home / Self Care | Payer: BC Managed Care – PPO | Source: Ambulatory Visit | Attending: Dermatology | Admitting: Dermatology

## 2017-08-29 DIAGNOSIS — M869 Osteomyelitis, unspecified: Secondary | ICD-10-CM

## 2017-08-29 DIAGNOSIS — L03031 Cellulitis of right toe: Secondary | ICD-10-CM | POA: Diagnosis present

## 2017-08-29 DIAGNOSIS — M19071 Primary osteoarthritis, right ankle and foot: Secondary | ICD-10-CM | POA: Diagnosis not present

## 2019-05-08 ENCOUNTER — Other Ambulatory Visit: Payer: Self-pay

## 2019-05-08 DIAGNOSIS — Z20822 Contact with and (suspected) exposure to covid-19: Secondary | ICD-10-CM

## 2019-05-10 LAB — NOVEL CORONAVIRUS, NAA: SARS-CoV-2, NAA: NOT DETECTED

## 2019-10-31 NOTE — H&P (Signed)
TOTAL HIP ADMISSION H&P  Patient is admitted for right total hip arthroplasty.  Subjective:  Chief Complaint: Right hip pain  HPI: Denise Steele, 64 y.o. female, has a history of pain and functional disability in the right hip due to arthritis and patient has failed non-surgical conservative treatments for greater than 12 weeks to include corticosteriod injections and activity modification. Onset of symptoms was gradual, starting 1 years ago with gradually worsening course since that time. The patient noted no past surgery on the right hip. Patient currently rates pain in the right hip at 8 out of 10 with activity. Patient has worsening of pain with activity and weight bearing, pain that interfers with activities of daily living, crepitus and instability. Patient has evidence of significant joint space narrowing almost to the point where she is bone-on-bone. She does have marginal osteophyte formation by imaging studies. This condition presents safety issues increasing the risk of falls. There is no current active infection.  There are no problems to display for this patient.   Past Medical History:  Diagnosis Date  . Medical history non-contributory   . PONV (postoperative nausea and vomiting)     Past Surgical History:  Procedure Laterality Date  . ANKLE FRACTURE SURGERY    . CYSTOSCOPY N/A 05/30/2013   Procedure: CYSTOSCOPY;  Surgeon: Logan Bores, MD;  Location: [facility];  Service: Gynecology;  Laterality: N/A;  . ENDOMETRIAL ABLATION    . LAPAROSCOPIC ASSISTED VAGINAL HYSTERECTOMY N/A 05/30/2013   Procedure: LAPAROSCOPIC ASSISTED VAGINAL HYSTERECTOMY;  Surgeon: Logan Bores, MD;  Location: [facility];  Service: Gynecology;  Laterality: N/A;  abdomen and vagins  . SALPINGOOPHORECTOMY N/A 05/30/2013   Procedure: SALPINGO OOPHORECTOMY;  Surgeon: Logan Bores, MD;  Location: [facility];  Service: Gynecology;  Laterality: N/A;  . TONSILLECTOMY      Prior to Admission  medications   Medication Sig Start Date End Date Taking? Authorizing Provider  aspirin EC 81 MG tablet Take 81 mg by mouth daily.   Yes [provider]  Biotin 10000 MCG TABS Take 10,000 mcg by mouth daily.   Yes [provider]  cholecalciferol (VITAMIN D) 25 MCG (1000 UNIT) tablet Take 1,000 Units by mouth daily.   Yes [provider]  estradiol (VIVELLE-DOT) 0.1 MG/24HR patch Place 1 patch (0.1 mg total) onto the skin 2 (two) times a week. Patient taking differently: Place 1 patch onto the skin 2 (two) times a week. Mondays & Thursdays 05/31/13  Yes Paula Compton, MD  lisinopril (ZESTRIL) 30 MG tablet Take 30 mg by mouth daily. 10/14/19  Yes [provider]  meloxicam (MOBIC) 15 MG tablet Take 15 mg by mouth daily. 10/12/19  Yes [provider]  Omega-3 Fatty Acids (OMEGA-3 2100 PO) Take 2,100 mg by mouth daily.   Yes [provider]    Allergies  Allergen Reactions  . Other     Anesthesia-nausea  . Penicillins Hives    Did it involve swelling of the face/tongue/throat, SOB, or low BP? Unknown Did it involve sudden or severe rash/hives, skin peeling, or any reaction on the inside of your mouth or nose? Yes Did you need to seek medical attention at a hospital or doctor's office? No When did it last happen? Childhood reaction. If all above answers are "NO", may proceed with cephalosporin use.     Social History   Socioeconomic History  . Marital status: Married    Spouse name: Not on file  . Number of children: Not  on file  . Years of education: Not on file  . Highest education level: Not on file  Occupational History  . Not on file  Tobacco Use  . Smoking status: Former Smoker  Substance and Sexual Activity  . Alcohol use: Yes    Comment: occ. glass of wine  . Drug use: No  . Sexual activity: Not on file  Other Topics Concern  . Not on file  Social History Narrative  . Not on file   Social Determinants of Health     Financial Resource Strain:   . Difficulty of Paying Living Expenses:   Food Insecurity:   . Worried About Charity fundraiser in the Last Year:   . Arboriculturist in the Last Year:   Transportation Needs:   . Film/video editor (Medical):   Marland Kitchen Lack of Transportation (Non-Medical):   Physical Activity:   . Days of Exercise per Week:   . Minutes of Exercise per Session:   Stress:   . Feeling of Stress :   Social Connections:   . Frequency of Communication with Friends and Family:   . Frequency of Social Gatherings with Friends and Family:   . Attends Religious Services:   . Active Member of Clubs or Organizations:   . Attends Archivist Meetings:   Marland Kitchen Marital Status:   Intimate Partner Violence:   . Fear of Current or Ex-Partner:   . Emotionally Abused:   Marland Kitchen Physically Abused:   . Sexually Abused:       Tobacco Use:   . Smoking Tobacco Use:   . Smokeless Tobacco Use:    Social History   Substance and Sexual Activity  Alcohol Use Yes   Comment: occ. glass of wine    No family history on file.  Review of Systems  Constitutional: Negative for chills and fever.  HENT: Negative for congestion, sore throat and tinnitus.   Eyes: Negative for double vision, photophobia and pain.  Respiratory: Negative for cough, shortness of breath and wheezing.   Cardiovascular: Negative for chest pain, palpitations and orthopnea.  Gastrointestinal: Negative for heartburn, nausea and vomiting.  Genitourinary: Negative for dysuria, frequency and urgency.  Musculoskeletal: Positive for joint pain.  Neurological: Negative for dizziness, weakness and headaches.     Objective:  Physical Exam: Well nourished and well developed.  General: Alert and oriented x3, cooperative and pleasant, no acute distress.  Head: normocephalic, atraumatic, neck supple.  Eyes: EOMI.  Respiratory: breath sounds clear in all fields, no wheezing, rales, or rhonchi. Cardiovascular: Regular rate  and rhythm, no murmurs, gallops or rubs.  Abdomen: non-tender to palpation and soft, normoactive bowel sounds. Musculoskeletal:  Right Hip: No tenderness to palpation about the right greater trochanteric bursa. No pain with passive motion of the right hip. ROM 110 degrees flexion, 5 degrees internal rotation, 5 degrees external rotation, and 30 degrees abduction.  Calves soft and nontender. Motor function intact in LE. Strength 5/5 LE bilaterally. Neuro: Distal pulses 2+. Sensation to light touch intact in LE.  Vital signs in last 24 hours: Blood pressure: 158/98 mmHg  Imaging Review Plain radiographs demonstrate severe degenerative joint disease of the right hip. The bone quality appears to be adequate for age and reported activity level.  Assessment/Plan:  End stage arthritis, right hip  The patient history, physical examination, clinical judgement of the provider and imaging studies are consistent with end stage degenerative joint disease of the right hip and total hip arthroplasty is  deemed medically necessary. The treatment options including medical management, injection therapy, arthroscopy and arthroplasty were discussed at length. The risks and benefits of total hip arthroplasty were presented and reviewed. The risks due to aseptic loosening, infection, stiffness, dislocation/subluxation, thromboembolic complications and other imponderables were discussed. The patient acknowledged the explanation, agreed to proceed with the plan and consent was signed. Patient is being admitted for inpatient treatment for surgery, pain control, PT, OT, prophylactic antibiotics, VTE prophylaxis, progressive ambulation and ADLs and discharge planning.The patient is planning to be discharged home.   Patient's anticipated LOS is less than 2 midnights, meeting these requirements: - Younger than 6 - Lives within 1 hour of care - Has a competent adult at home to recover with post-op recover - NO history  of  - Chronic pain requiring opiods  - Diabetes  - Coronary Artery Disease  - Heart failure  - Heart attack  - Stroke  - DVT/VTE  - Cardiac arrhythmia  - Respiratory Failure/COPD  - Renal failure  - Anemia  - Advanced Liver disease  Therapy Plans: HEP Disposition: Home with husband Planned DVT Prophylaxis: Aspirin 325 mg BID DME Needed: None PCP: Monico Blitz, MD TXA: IV Allergies: PCN (hives) Anesthesia Concerns: Nausea/vomiting (hx of vomiting with colonoscopy) BMI: 31.7 Other: SDD  - Patient was instructed on what medications to stop prior to surgery. - Follow-up visit in 2 weeks with Dr. Wynelle Link - Begin physical therapy following surgery - Pre-operative lab work as pre-surgical testing - Prescriptions will be provided in hospital at time of discharge  Theresa Duty, PA-C Orthopedic Surgery [facility]

## 2019-11-04 NOTE — Patient Instructions (Addendum)
DUE TO COVID-19 ONLY TWO VISITORS ARE ALLOWED TO COME WITH YOU AND STAY IN THE WAITING ROOM ONLY DURING PRE OP AND PROCEDURE. THE TWO VISITORS MAY VISIT WITH YOU IN YOUR PRIVATE ROOM DURING VISITING HOURS ONLY!!   COVID SWAB TESTING MUST BE COMPLETED ON:  Saturday, November 09, 2019 at 12:30 PM   [address], [place] -[facility] enter pre surgical testing line (Must self quarantine after testing. Follow instructions on handout.)             Your procedure is scheduled on: Wednesday, November 13, 2019   Report to [facility] Main  Entrance    Report to admitting at 6:30 AM   Call this number if you have problems the morning of surgery [phone]   Do not eat food:After Midnight.   May have liquids until 5:30 AM day of surgery   CLEAR LIQUID DIET  Foods Allowed                                                                     Foods Excluded  Water, Black Coffee and tea, regular and decaf                             liquids that you cannot  Plain Jell-O in any flavor  (No red)                                           see through such as: Fruit ices (not with fruit pulp)                                     milk, soups, orange juice  Iced Popsicles (No red)                                    All solid food Carbonated beverages, regular and diet                                    Apple juices Sports drinks like Gatorade (No red) Lightly seasoned clear broth or consume(fat free) Sugar, honey syrup  Sample Menu Breakfast                                Lunch                                     Supper Cranberry juice                    Beef broth                            Chicken broth Jell-O  Grape juice                           Apple juice Coffee or tea                        Jell-O                                      Popsicle                                                Coffee or tea                         Coffee or tea   Complete one Ensure drink the morning of surgery at 5:30 AM the day of surgery.   Oral Hygiene is also important to reduce your risk of infection.                                    Remember - BRUSH YOUR TEETH THE MORNING OF SURGERY WITH YOUR REGULAR TOOTHPASTE   Do NOT smoke after Midnight   Take these medicines the morning of surgery with A SIP OF WATER: None                               You may not have any metal on your body including hair pins, jewelry, and body piercings             Do not wear make-up, lotions, powders, perfumes/cologne, or deodorant             Do not wear nail polish.  Do not shave  48 hours prior to surgery.                Do not bring valuables to the hospital. [employer].   Contacts, dentures or bridgework may not be worn into surgery.   Bring small overnight bag day of surgery.    Patients discharged the day of surgery will not be allowed to drive home.   Special Instructions: Bring a copy of your healthcare power of attorney and living will documents         the day of surgery if you haven't scanned them in before.              Please read over the following fact sheets you were given:  [facility] - Preparing for Surgery Before surgery, you can play an important role.  Because skin is not sterile, your skin needs to be as free of germs as possible.  You can reduce the number of germs on your skin by washing with CHG (chlorahexidine gluconate) soap before surgery.  CHG is an antiseptic cleaner which kills germs and bonds with the skin to continue killing germs even after washing. Please DO NOT use if you have an allergy to CHG or antibacterial soaps.  If your skin becomes reddened/irritated stop using  the CHG and inform your nurse when you arrive at Short Stay. Do not shave (including legs and underarms) for at least 48 hours prior to the first CHG shower.  You may shave your  face/neck. Please follow these instructions carefully:  1.  Shower with CHG Soap the night before surgery and the  morning of Surgery.  2.  If you choose to wash your hair, wash your hair first as usual with your  normal  shampoo.  3.  After you shampoo, rinse your hair and body thoroughly to remove the  shampoo.                             4.  Use CHG as you would any other liquid soap.  You can apply chg directly  to the skin and wash                       Gently with a scrungie or clean washcloth.  5.  Apply the CHG Soap to your body ONLY FROM THE NECK DOWN.   Do not use on face/ open                           Wound or open sores. Avoid contact with eyes, ears mouth and genitals (private parts).                       Wash face,  Genitals (private parts) with your normal soap.             6.  Wash thoroughly, paying special attention to the area where your surgery  will be performed.  7.  Thoroughly rinse your body with warm water from the neck down.  8.  DO NOT shower/wash with your normal soap after using and rinsing off  the CHG Soap.                9.  Pat yourself dry with a clean towel.            10.  Wear clean pajamas.            11.  Place clean sheets on your bed the night of your first shower and do not  sleep with pets. Day of Surgery : Do not apply any lotions/deodorants the morning of surgery.  Please wear clean clothes to the hospital/surgery center.  FAILURE TO FOLLOW THESE INSTRUCTIONS MAY RESULT IN THE CANCELLATION OF YOUR SURGERY PATIENT SIGNATURE_________________________________  NURSE SIGNATURE__________________________________  ________________________________________________________________________    WHAT IS A BLOOD TRANSFUSION? Blood Transfusion Information  A transfusion is the replacement of blood or some of its parts. Blood is made up of multiple cells which provide different functions.  Red blood cells carry oxygen and are used for blood loss  replacement.  White blood cells fight against infection.  Platelets control bleeding.  Plasma helps clot blood.  Other blood products are available for specialized needs, such as hemophilia or other clotting disorders. BEFORE THE TRANSFUSION  Who gives blood for transfusions?   Healthy volunteers who are fully evaluated to make sure their blood is safe. This is blood bank blood. Transfusion therapy is the safest it has ever been in the practice of medicine. Before blood is taken from a donor, a complete history is taken to make sure that person has no history of diseases nor engages in risky  social behavior (examples are intravenous drug use or sexual activity with multiple partners). The donor's travel history is screened to minimize risk of transmitting infections, such as malaria. The donated blood is tested for signs of infectious diseases, such as HIV and hepatitis. The blood is then tested to be sure it is compatible with you in order to minimize the chance of a transfusion reaction. If you or a relative donates blood, this is often done in anticipation of surgery and is not appropriate for emergency situations. It takes many days to process the donated blood. RISKS AND COMPLICATIONS Although transfusion therapy is very safe and saves many lives, the main dangers of transfusion include:   Getting an infectious disease.  Developing a transfusion reaction. This is an allergic reaction to something in the blood you were given. Every precaution is taken to prevent this. The decision to have a blood transfusion has been considered carefully by your caregiver before blood is given. Blood is not given unless the benefits outweigh the risks. AFTER THE TRANSFUSION  Right after receiving a blood transfusion, you will usually feel much better and more energetic. This is especially true if your red blood cells have gotten low (anemic). The transfusion raises the level of the red blood cells which  carry oxygen, and this usually causes an energy increase.  The nurse administering the transfusion will monitor you carefully for complications. HOME CARE INSTRUCTIONS  No special instructions are needed after a transfusion. You may find your energy is better. Speak with your caregiver about any limitations on activity for underlying diseases you may have. SEEK MEDICAL CARE IF:   Your condition is not improving after your transfusion.  You develop redness or irritation at the intravenous (IV) site. SEEK IMMEDIATE MEDICAL CARE IF:  Any of the following symptoms occur over the next 12 hours:  Shaking chills.  You have a temperature by mouth above 102 F (38.9 C), not controlled by medicine.  Chest, back, or muscle pain.  People around you feel you are not acting correctly or are confused.  Shortness of breath or difficulty breathing.  Dizziness and fainting.  You get a rash or develop hives.  You have a decrease in urine output.  Your urine turns a dark color or changes to pink, red, or brown. Any of the following symptoms occur over the next 10 days:  You have a temperature by mouth above 102 F (38.9 C), not controlled by medicine.  Shortness of breath.  Weakness after normal activity.  The white part of the eye turns yellow (jaundice).  You have a decrease in the amount of urine or are urinating less often.  Your urine turns a dark color or changes to pink, red, or brown. Document Released: 07/15/2000 Document Revised: 10/10/2011 Document Reviewed: 03/03/2008 [facility] Patient Information 2014 [place], [place].  _______________________________________________________________________

## 2019-11-05 ENCOUNTER — Encounter (HOSPITAL_COMMUNITY): Payer: Self-pay

## 2019-11-05 ENCOUNTER — Encounter (HOSPITAL_COMMUNITY)
Admission: RE | Admit: 2019-11-05 | Discharge: 2019-11-05 | Disposition: A | Discharge status: Home / Self Care | Payer: Medicare Other | Source: Ambulatory Visit | Attending: Orthopedic Surgery | Admitting: Orthopedic Surgery

## 2019-11-05 ENCOUNTER — Other Ambulatory Visit: Payer: Self-pay

## 2019-11-05 DIAGNOSIS — Z01810 Encounter for preprocedural cardiovascular examination: Secondary | ICD-10-CM | POA: Insufficient documentation

## 2019-11-05 DIAGNOSIS — Z01812 Encounter for preprocedural laboratory examination: Secondary | ICD-10-CM | POA: Insufficient documentation

## 2019-11-05 HISTORY — DX: Unspecified osteoarthritis, unspecified site: M19.90

## 2019-11-05 HISTORY — DX: Essential (primary) hypertension: I10

## 2019-11-05 HISTORY — DX: Personal history of urinary calculi: Z87.442

## 2019-11-05 LAB — COMPREHENSIVE METABOLIC PANEL
ALT: 27 U/L (ref 0–44)
AST: 23 U/L (ref 15–41)
Albumin: 4.4 g/dL (ref 3.5–5.0)
Alkaline Phosphatase: 62 U/L (ref 38–126)
Anion gap: 9 (ref 5–15)
BUN: 20 mg/dL (ref 8–23)
CO2: 23 mmol/L (ref 22–32)
Calcium: 9.2 mg/dL (ref 8.9–10.3)
Chloride: 108 mmol/L (ref 98–111)
Creatinine, Ser: 0.7 mg/dL (ref 0.44–1.00)
GFR calc Af Amer: >60 mL/min (ref >60)
GFR calc non Af Amer: >60 mL/min (ref >60)
Glucose, Bld: 78 mg/dL (ref 70–99)
Potassium: 4.2 mmol/L (ref 3.5–5.1)
Sodium: 140 mmol/L (ref 135–145)
Total Bilirubin: 0.4 mg/dL (ref 0.3–1.2)
Total Protein: 7.3 g/dL (ref 6.5–8.1)

## 2019-11-05 LAB — CBC
HCT: 45.8 % (ref 36.0–46.0)
Hemoglobin: 14.7 g/dL (ref 12.0–15.0)
MCH: 30.6 pg (ref 26.0–34.0)
MCHC: 32.1 g/dL (ref 30.0–36.0)
MCV: 95.4 fL (ref 80.0–100.0)
Platelets: 240 10*3/uL (ref 150–400)
RBC: 4.8 MIL/uL (ref 3.87–5.11)
RDW: 12.2 % (ref 11.5–15.5)
WBC: 7.3 10*3/uL (ref 4.0–10.5)
nRBC: 0 % (ref 0.0–0.2)

## 2019-11-05 LAB — SURGICAL PCR SCREEN
MRSA, PCR: NEGATIVE
Staphylococcus aureus: NEGATIVE

## 2019-11-05 LAB — APTT: aPTT: 29 seconds (ref 24–36)

## 2019-11-05 LAB — PROTIME-INR
INR: 1 (ref 0.8–1.2)
Prothrombin Time: 12.9 seconds (ref 11.4–15.2)

## 2019-11-05 NOTE — Progress Notes (Addendum)
COVID vaccine series completed  PCP - Dr. Anthonette Legato Cardiologist - N/A  Chest x-ray - N/A EKG - 11/05/19 in epic Stress Test - N/A ECHO - N/A Cardiac Cath - N/A  Sleep Study - N/A CPAP -  N/A Fasting Blood Sugar - N/A Checks Blood Sugar __N/A___ times a day  Blood Thinner Instructions: N/A Aspirin Instructions: Yes Last Dose: 11/05/19  Anesthesia review: N/A  Patient denies shortness of breath, fever, cough and chest pain at PAT appointment   Patient verbalized understanding of instructions that were given to them at the PAT appointment. Patient was also instructed that they will need to review over the PAT instructions again at home before surgery.

## 2019-11-06 LAB — ABO/RH: ABO/RH(D): AB POS

## 2019-11-09 ENCOUNTER — Other Ambulatory Visit (HOSPITAL_COMMUNITY)
Admission: RE | Admit: 2019-11-09 | Discharge: 2019-11-09 | Disposition: A | Discharge status: Home / Self Care | Payer: Medicare Other | Source: Ambulatory Visit | Attending: Orthopedic Surgery | Admitting: Orthopedic Surgery

## 2019-11-09 DIAGNOSIS — Z20822 Contact with and (suspected) exposure to covid-19: Secondary | ICD-10-CM | POA: Insufficient documentation

## 2019-11-09 DIAGNOSIS — Z01812 Encounter for preprocedural laboratory examination: Secondary | ICD-10-CM | POA: Insufficient documentation

## 2019-11-09 LAB — SARS CORONAVIRUS 2 (TAT 6-24 HRS): SARS Coronavirus 2: NEGATIVE

## 2019-11-12 NOTE — Anesthesia Preprocedure Evaluation (Addendum)
Anesthesia Evaluation  Patient identified by MRN, date of birth, ID band Patient awake    Reviewed: Allergy & Precautions, NPO status , Patient's Chart, lab work & pertinent test results  History of Anesthesia Complications (+) PONV and history of anesthetic complications  Airway Mallampati: I  TM Distance: >3 FB Neck ROM: Full    Dental no notable dental hx.    Pulmonary former smoker,    Pulmonary exam normal        Cardiovascular hypertension, Pt. on medications Normal cardiovascular exam     Neuro/Psych negative neurological ROS  negative psych ROS   GI/Hepatic negative GI ROS, Neg liver ROS,   Endo/Other  negative endocrine ROS  Renal/GU negative Renal ROS  negative genitourinary   Musculoskeletal  (+) Arthritis ,   Abdominal   Peds  Hematology negative hematology ROS (+)   Anesthesia Other Findings Day of surgery medications reviewed with patient.  Reproductive/Obstetrics negative OB ROS                            Anesthesia Physical Anesthesia Plan  ASA: II  Anesthesia Plan: Spinal   Post-op Pain Management:    Induction:   PONV Risk Score and Plan: 4 or greater and Midazolam, Treatment may vary due to age or medical condition, Ondansetron, Dexamethasone and Propofol infusion  Airway Management Planned: Natural Airway and Simple Face Mask  Additional Equipment: None  Intra-op Plan:   Post-operative Plan:   Informed Consent: I have reviewed the patients History and Physical, chart, labs and discussed the procedure including the risks, benefits and alternatives for the proposed anesthesia with the patient or authorized representative who has indicated his/her understanding and acceptance.       Plan Discussed with: CRNA  Anesthesia Plan Comments:        Anesthesia Quick Evaluation

## 2019-11-13 ENCOUNTER — Ambulatory Visit (HOSPITAL_COMMUNITY): Payer: Medicare Other

## 2019-11-13 ENCOUNTER — Other Ambulatory Visit: Payer: Self-pay

## 2019-11-13 ENCOUNTER — Ambulatory Visit (HOSPITAL_COMMUNITY): Payer: Medicare Other | Admitting: Anesthesiology

## 2019-11-13 ENCOUNTER — Encounter (HOSPITAL_COMMUNITY)
Admission: RE | Disposition: A | Discharge status: Home / Self Care | Payer: Self-pay | Source: Ambulatory Visit | Attending: Orthopedic Surgery

## 2019-11-13 ENCOUNTER — Encounter (HOSPITAL_COMMUNITY): Payer: Self-pay | Admitting: Orthopedic Surgery

## 2019-11-13 ENCOUNTER — Ambulatory Visit (HOSPITAL_COMMUNITY)
Admission: RE | Admit: 2019-11-13 | Discharge: 2019-11-14 | Disposition: A | Discharge status: Home / Self Care | Payer: Medicare Other | Source: Ambulatory Visit | Attending: Orthopedic Surgery | Admitting: Orthopedic Surgery

## 2019-11-13 DIAGNOSIS — M1611 Unilateral primary osteoarthritis, right hip: Secondary | ICD-10-CM | POA: Insufficient documentation

## 2019-11-13 DIAGNOSIS — Z880 Allergy status to penicillin: Secondary | ICD-10-CM | POA: Diagnosis not present

## 2019-11-13 DIAGNOSIS — Z884 Allergy status to anesthetic agent status: Secondary | ICD-10-CM | POA: Insufficient documentation

## 2019-11-13 DIAGNOSIS — Z419 Encounter for procedure for purposes other than remedying health state, unspecified: Secondary | ICD-10-CM

## 2019-11-13 DIAGNOSIS — M169 Osteoarthritis of hip, unspecified: Secondary | ICD-10-CM | POA: Diagnosis present

## 2019-11-13 DIAGNOSIS — Z7982 Long term (current) use of aspirin: Secondary | ICD-10-CM | POA: Diagnosis not present

## 2019-11-13 DIAGNOSIS — Z79899 Other long term (current) drug therapy: Secondary | ICD-10-CM | POA: Diagnosis not present

## 2019-11-13 DIAGNOSIS — Z96649 Presence of unspecified artificial hip joint: Secondary | ICD-10-CM

## 2019-11-13 DIAGNOSIS — Z96641 Presence of right artificial hip joint: Secondary | ICD-10-CM | POA: Diagnosis present

## 2019-11-13 DIAGNOSIS — Z791 Long term (current) use of non-steroidal anti-inflammatories (NSAID): Secondary | ICD-10-CM | POA: Insufficient documentation

## 2019-11-13 DIAGNOSIS — I10 Essential (primary) hypertension: Secondary | ICD-10-CM | POA: Insufficient documentation

## 2019-11-13 DIAGNOSIS — Z87891 Personal history of nicotine dependence: Secondary | ICD-10-CM | POA: Diagnosis not present

## 2019-11-13 HISTORY — PX: TOTAL HIP ARTHROPLASTY: SHX124

## 2019-11-13 LAB — TYPE AND SCREEN
ABO/RH(D): AB POS
Antibody Screen: NEGATIVE

## 2019-11-13 SURGERY — ARTHROPLASTY, HIP, TOTAL, ANTERIOR APPROACH
Anesthesia: Spinal | Site: Hip | Laterality: Right

## 2019-11-13 MED ORDER — SODIUM CHLORIDE 0.9 % IV SOLN: INTRAVENOUS | Status: DC

## 2019-11-13 MED ORDER — LIDOCAINE 2% (20 MG/ML) 5 ML SYRINGE
INTRAMUSCULAR | Status: AC
  Filled 2019-11-13: qty 5

## 2019-11-13 MED ORDER — MAGNESIUM CITRATE PO SOLN: 1.0000 | Freq: Once | ORAL | Status: DC | PRN

## 2019-11-13 MED ORDER — TRAMADOL HCL 50 MG PO TABS
50.0000 mg | ORAL_TABLET | Freq: Four times a day (QID) | ORAL | Status: DC | PRN
  Administered 2019-11-13 – 2019-11-14 (×2): 100 mg via ORAL
  Filled 2019-11-13 (×2): qty 2

## 2019-11-13 MED ORDER — ONDANSETRON HCL 4 MG/2ML IJ SOLN: 4.0000 mg | Freq: Four times a day (QID) | INTRAMUSCULAR | Status: DC | PRN

## 2019-11-13 MED ORDER — LACTATED RINGERS IV SOLN: INTRAVENOUS | Status: DC

## 2019-11-13 MED ORDER — MEPIVACAINE HCL (PF) 2 % IJ SOLN
INTRAMUSCULAR | Status: DC | PRN
  Administered 2019-11-13: 3 mL via INTRATHECAL

## 2019-11-13 MED ORDER — DOCUSATE SODIUM 100 MG PO CAPS
100.0000 mg | ORAL_CAPSULE | Freq: Two times a day (BID) | ORAL | Status: DC
  Administered 2019-11-13 – 2019-11-14 (×2): 100 mg via ORAL
  Filled 2019-11-13 (×2): qty 1

## 2019-11-13 MED ORDER — DEXAMETHASONE SODIUM PHOSPHATE 10 MG/ML IJ SOLN
8.0000 mg | Freq: Once | INTRAMUSCULAR | Status: AC
  Administered 2019-11-13: 08:00:00 8 mg via INTRAVENOUS

## 2019-11-13 MED ORDER — HYDROCODONE-ACETAMINOPHEN 5-325 MG PO TABS: 1.0000 | ORAL_TABLET | Freq: Four times a day (QID) | ORAL | 0 refills | Status: DC | PRN

## 2019-11-13 MED ORDER — TRANEXAMIC ACID-NACL 1000-0.7 MG/100ML-% IV SOLN
1000.0000 mg | INTRAVENOUS | Status: AC
  Administered 2019-11-13: 09:00:00 1000 mg via INTRAVENOUS
  Filled 2019-11-13: qty 100

## 2019-11-13 MED ORDER — OXYCODONE HCL 5 MG PO TABS: 5.0000 mg | ORAL_TABLET | Freq: Once | ORAL | Status: AC | PRN

## 2019-11-13 MED ORDER — FENTANYL CITRATE (PF) 100 MCG/2ML IJ SOLN
INTRAMUSCULAR | Status: AC
  Filled 2019-11-13: qty 2

## 2019-11-13 MED ORDER — METHOCARBAMOL 500 MG PO TABS
500.0000 mg | ORAL_TABLET | Freq: Four times a day (QID) | ORAL | Status: DC | PRN
  Administered 2019-11-13 – 2019-11-14 (×3): 500 mg via ORAL
  Filled 2019-11-13 (×3): qty 1

## 2019-11-13 MED ORDER — PROPOFOL 500 MG/50ML IV EMUL
INTRAVENOUS | Status: AC
  Filled 2019-11-13: qty 100

## 2019-11-13 MED ORDER — METOCLOPRAMIDE HCL 5 MG/ML IJ SOLN: 5.0000 mg | Freq: Three times a day (TID) | INTRAMUSCULAR | Status: DC | PRN

## 2019-11-13 MED ORDER — DEXAMETHASONE SODIUM PHOSPHATE 10 MG/ML IJ SOLN
10.0000 mg | Freq: Once | INTRAMUSCULAR | Status: AC
  Administered 2019-11-14: 10 mg via INTRAVENOUS
  Filled 2019-11-13: qty 1

## 2019-11-13 MED ORDER — PROPOFOL 10 MG/ML IV BOLUS
INTRAVENOUS | Status: DC | PRN
  Administered 2019-11-13: 10 mg via INTRAVENOUS
  Administered 2019-11-13: 20 mg via INTRAVENOUS

## 2019-11-13 MED ORDER — ONDANSETRON HCL 4 MG/2ML IJ SOLN
INTRAMUSCULAR | Status: AC
  Filled 2019-11-13: qty 2

## 2019-11-13 MED ORDER — ALBUMIN HUMAN 5 % IV SOLN: INTRAVENOUS | Status: DC | PRN

## 2019-11-13 MED ORDER — ACETAMINOPHEN 500 MG PO TABS: 1000.0000 mg | ORAL_TABLET | Freq: Once | ORAL | Status: DC

## 2019-11-13 MED ORDER — TRAMADOL HCL 50 MG PO TABS: 50.0000 mg | ORAL_TABLET | Freq: Four times a day (QID) | ORAL | 0 refills | Status: DC | PRN

## 2019-11-13 MED ORDER — MORPHINE SULFATE (PF) 2 MG/ML IV SOLN: 0.5000 mg | INTRAVENOUS | Status: DC | PRN

## 2019-11-13 MED ORDER — CHLORHEXIDINE GLUCONATE 4 % EX LIQD: 60.0000 mL | Freq: Once | CUTANEOUS | Status: DC

## 2019-11-13 MED ORDER — PHENOL 1.4 % MT LIQD: 1.0000 | OROMUCOSAL | Status: DC | PRN

## 2019-11-13 MED ORDER — BUPIVACAINE HCL 0.25 % IJ SOLN
INTRAMUSCULAR | Status: DC | PRN
  Administered 2019-11-13: 30 mL

## 2019-11-13 MED ORDER — MENTHOL 3 MG MT LOZG: 1.0000 | LOZENGE | OROMUCOSAL | Status: DC | PRN

## 2019-11-13 MED ORDER — ASPIRIN EC 325 MG PO TBEC: 325.0000 mg | DELAYED_RELEASE_TABLET | Freq: Two times a day (BID) | ORAL | 0 refills | Status: AC

## 2019-11-13 MED ORDER — CEFAZOLIN SODIUM-DEXTROSE 2-4 GM/100ML-% IV SOLN
2.0000 g | INTRAVENOUS | Status: AC
  Administered 2019-11-13: 08:00:00 2 g via INTRAVENOUS
  Filled 2019-11-13: qty 100

## 2019-11-13 MED ORDER — ACETAMINOPHEN 325 MG PO TABS: 325.0000 mg | ORAL_TABLET | Freq: Four times a day (QID) | ORAL | Status: DC | PRN

## 2019-11-13 MED ORDER — METHOCARBAMOL 500 MG PO TABS: 500.0000 mg | ORAL_TABLET | Freq: Four times a day (QID) | ORAL | 0 refills | Status: DC | PRN

## 2019-11-13 MED ORDER — METHOCARBAMOL 500 MG IVPB - SIMPLE MED
INTRAVENOUS | Status: AC
  Filled 2019-11-13: qty 50

## 2019-11-13 MED ORDER — 0.9 % SODIUM CHLORIDE (POUR BTL) OPTIME
TOPICAL | Status: DC | PRN
  Administered 2019-11-13: 09:00:00 1000 mL

## 2019-11-13 MED ORDER — METOCLOPRAMIDE HCL 5 MG PO TABS: 5.0000 mg | ORAL_TABLET | Freq: Three times a day (TID) | ORAL | Status: DC | PRN

## 2019-11-13 MED ORDER — METHOCARBAMOL 500 MG IVPB - SIMPLE MED
500.0000 mg | Freq: Four times a day (QID) | INTRAVENOUS | Status: DC | PRN
  Administered 2019-11-13: 11:00:00 500 mg via INTRAVENOUS
  Filled 2019-11-13: qty 50

## 2019-11-13 MED ORDER — LACTATED RINGERS IV BOLUS
250.0000 mL | Freq: Once | INTRAVENOUS | Status: AC
  Administered 2019-11-13: 250 mL via INTRAVENOUS

## 2019-11-13 MED ORDER — DEXAMETHASONE SODIUM PHOSPHATE 10 MG/ML IJ SOLN
INTRAMUSCULAR | Status: AC
  Filled 2019-11-13: qty 1

## 2019-11-13 MED ORDER — CEFAZOLIN SODIUM-DEXTROSE 2-4 GM/100ML-% IV SOLN
INTRAVENOUS | Status: AC
  Administered 2019-11-13: 2 g via INTRAVENOUS
  Filled 2019-11-13: qty 100

## 2019-11-13 MED ORDER — FENTANYL CITRATE (PF) 100 MCG/2ML IJ SOLN
INTRAMUSCULAR | Status: DC | PRN
  Administered 2019-11-13: 50 ug via INTRAVENOUS

## 2019-11-13 MED ORDER — CEFAZOLIN SODIUM-DEXTROSE 2-4 GM/100ML-% IV SOLN
2.0000 g | Freq: Four times a day (QID) | INTRAVENOUS | Status: AC
  Administered 2019-11-13: 2 g via INTRAVENOUS
  Filled 2019-11-13: qty 100

## 2019-11-13 MED ORDER — PROPOFOL 500 MG/50ML IV EMUL
INTRAVENOUS | Status: DC | PRN
  Administered 2019-11-13: 75 ug/kg/min via INTRAVENOUS

## 2019-11-13 MED ORDER — OXYCODONE HCL 5 MG PO TABS
ORAL_TABLET | ORAL | Status: AC
  Administered 2019-11-13: 5 mg via ORAL
  Filled 2019-11-13: qty 1

## 2019-11-13 MED ORDER — ACETAMINOPHEN 10 MG/ML IV SOLN
1000.0000 mg | Freq: Four times a day (QID) | INTRAVENOUS | Status: DC
  Administered 2019-11-13: 09:00:00 1000 mg via INTRAVENOUS
  Filled 2019-11-13: qty 100

## 2019-11-13 MED ORDER — LIDOCAINE HCL (CARDIAC) PF 100 MG/5ML IV SOSY
PREFILLED_SYRINGE | INTRAVENOUS | Status: DC | PRN
  Administered 2019-11-13: 40 mg via INTRATRACHEAL

## 2019-11-13 MED ORDER — BUPIVACAINE HCL 0.25 % IJ SOLN
INTRAMUSCULAR | Status: AC
  Filled 2019-11-13: qty 1

## 2019-11-13 MED ORDER — MEPIVACAINE HCL (PF) 2 % IJ SOLN
INTRAMUSCULAR | Status: AC
  Filled 2019-11-13: qty 20

## 2019-11-13 MED ORDER — ASPIRIN EC 325 MG PO TBEC
325.0000 mg | DELAYED_RELEASE_TABLET | Freq: Two times a day (BID) | ORAL | Status: DC
  Administered 2019-11-14: 325 mg via ORAL
  Filled 2019-11-13: qty 1

## 2019-11-13 MED ORDER — POLYETHYLENE GLYCOL 3350 17 G PO PACK: 17.0000 g | PACK | Freq: Every day | ORAL | Status: DC | PRN

## 2019-11-13 MED ORDER — OXYCODONE HCL 5 MG/5ML PO SOLN: 5.0000 mg | Freq: Once | ORAL | Status: AC | PRN

## 2019-11-13 MED ORDER — BISACODYL 10 MG RE SUPP: 10.0000 mg | Freq: Every day | RECTAL | Status: DC | PRN

## 2019-11-13 MED ORDER — HYDROCODONE-ACETAMINOPHEN 5-325 MG PO TABS
1.0000 | ORAL_TABLET | ORAL | Status: DC | PRN
  Administered 2019-11-13 (×2): 1 via ORAL
  Administered 2019-11-14 (×2): 2 via ORAL
  Filled 2019-11-13 (×2): qty 1
  Filled 2019-11-13 (×3): qty 2

## 2019-11-13 MED ORDER — WATER FOR IRRIGATION, STERILE IR SOLN
Status: DC | PRN
  Administered 2019-11-13: 2000 mL

## 2019-11-13 MED ORDER — PROMETHAZINE HCL 25 MG/ML IJ SOLN: 6.2500 mg | INTRAMUSCULAR | Status: DC | PRN

## 2019-11-13 MED ORDER — FENTANYL CITRATE (PF) 100 MCG/2ML IJ SOLN
25.0000 ug | INTRAMUSCULAR | Status: DC | PRN
  Administered 2019-11-13: 50 ug via INTRAVENOUS

## 2019-11-13 MED ORDER — ONDANSETRON HCL 4 MG/2ML IJ SOLN
INTRAMUSCULAR | Status: DC | PRN
  Administered 2019-11-13: 4 mg via INTRAVENOUS

## 2019-11-13 MED ORDER — LACTATED RINGERS IV BOLUS: 250.0000 mL | Freq: Once | INTRAVENOUS | Status: DC

## 2019-11-13 MED ORDER — POVIDONE-IODINE 10 % EX SWAB
2.0000 "application " | Freq: Once | CUTANEOUS | Status: AC
  Administered 2019-11-13: 2 via TOPICAL

## 2019-11-13 MED ORDER — ONDANSETRON HCL 4 MG PO TABS: 4.0000 mg | ORAL_TABLET | Freq: Four times a day (QID) | ORAL | Status: DC | PRN

## 2019-11-13 MED ORDER — LACTATED RINGERS IV BOLUS
500.0000 mL | Freq: Once | INTRAVENOUS | Status: AC
  Administered 2019-11-13: 500 mL via INTRAVENOUS

## 2019-11-13 SURGICAL SUPPLY — 46 items
BAG DECANTER FOR FLEXI CONT (MISCELLANEOUS) IMPLANT
BAG SPEC THK2 15X12 ZIP CLS (MISCELLANEOUS) ×1
BAG ZIPLOCK 12X15 (MISCELLANEOUS) ×3 IMPLANT
BLADE SAG 18X100X1.27 (BLADE) ×3 IMPLANT
CLOSURE WOUND 1/2 X4 (GAUZE/BANDAGES/DRESSINGS) ×2
COVER PERINEAL POST (MISCELLANEOUS) ×3 IMPLANT
COVER SURGICAL LIGHT HANDLE (MISCELLANEOUS) ×3 IMPLANT
COVER WAND RF STERILE (DRAPES) IMPLANT
CUP ACETBLR 48 OD SECTOR II (Hips) ×3 IMPLANT
DECANTER SPIKE VIAL GLASS SM (MISCELLANEOUS) ×3 IMPLANT
DRAPE STERI IOBAN 125X83 (DRAPES) ×3 IMPLANT
DRAPE U-SHAPE 47X51 STRL (DRAPES) ×6 IMPLANT
DRSG ADAPTIC 3X8 NADH LF (GAUZE/BANDAGES/DRESSINGS) ×3 IMPLANT
DRSG AQUACEL AG ADV 3.5X10 (GAUZE/BANDAGES/DRESSINGS) ×3 IMPLANT
DURAPREP 26ML APPLICATOR (WOUND CARE) ×3 IMPLANT
ELECT REM PT RETURN 15FT ADLT (MISCELLANEOUS) ×3 IMPLANT
EVACUATOR 1/8 PVC DRAIN (DRAIN) IMPLANT
GLOVE BIO SURGEON STRL SZ 6 (GLOVE) ×3 IMPLANT
GLOVE BIO SURGEON STRL SZ7 (GLOVE) IMPLANT
GLOVE BIO SURGEON STRL SZ8 (GLOVE) ×3 IMPLANT
GLOVE BIOGEL PI IND STRL 6.5 (GLOVE) ×1 IMPLANT
GLOVE BIOGEL PI IND STRL 7.0 (GLOVE) IMPLANT
GLOVE BIOGEL PI IND STRL 8 (GLOVE) ×1 IMPLANT
GLOVE BIOGEL PI INDICATOR 6.5 (GLOVE) ×2
GLOVE BIOGEL PI INDICATOR 7.0 (GLOVE)
GLOVE BIOGEL PI INDICATOR 8 (GLOVE) ×2
GOWN STRL REUS W/TWL LRG LVL3 (GOWN DISPOSABLE) ×3 IMPLANT
GOWN STRL REUS W/TWL XL LVL3 (GOWN DISPOSABLE) IMPLANT
HEAD CERAMIC DELTA 28M 12/14P5 (Head) ×3 IMPLANT
HOLDER FOLEY CATH W/STRAP (MISCELLANEOUS) ×3 IMPLANT
KIT TURNOVER KIT A (KITS) IMPLANT
LINER MARATHON 28 48 (Hips) ×3 IMPLANT
MANIFOLD NEPTUNE II (INSTRUMENTS) ×3 IMPLANT
PACK ANTERIOR HIP CUSTOM (KITS) ×3 IMPLANT
PENCIL SMOKE EVACUATOR COATED (MISCELLANEOUS) ×3 IMPLANT
STEM FEM SZ3 STD ACTIS (Stem) ×3 IMPLANT
STRIP CLOSURE SKIN 1/2X4 (GAUZE/BANDAGES/DRESSINGS) ×4 IMPLANT
SUT ETHIBOND NAB CT1 #1 30IN (SUTURE) ×3 IMPLANT
SUT MNCRL AB 4-0 PS2 18 (SUTURE) ×3 IMPLANT
SUT STRATAFIX 0 PDS 27 VIOLET (SUTURE) ×3
SUT VIC AB 2-0 CT1 27 (SUTURE) ×6
SUT VIC AB 2-0 CT1 TAPERPNT 27 (SUTURE) ×2 IMPLANT
SUTURE STRATFX 0 PDS 27 VIOLET (SUTURE) ×1 IMPLANT
SYR 50ML LL SCALE MARK (SYRINGE) IMPLANT
TRAY FOLEY MTR SLVR 16FR STAT (SET/KITS/TRAYS/PACK) ×3 IMPLANT
YANKAUER SUCT BULB TIP 10FT TU (MISCELLANEOUS) ×3 IMPLANT

## 2019-11-13 NOTE — Transfer of Care (Signed)
Immediate Anesthesia Transfer of Care Note  Patient: Denise Steele  Procedure(s) Performed: Procedure(s) with comments: TOTAL HIP ARTHROPLASTY ANTERIOR APPROACH (Right) - 136min  Patient Location: PACU  Anesthesia Type:Spinal  Level of Consciousness:  sedated, patient cooperative and responds to stimulation  Airway & Oxygen Therapy:Patient Spontanous Breathing and Patient connected to face mask oxgen  Post-op Assessment:  Report given to PACU RN and Post -op Vital signs reviewed and stable  Post vital signs:  Reviewed and stable  Last Vitals:  Vitals:   11/13/19 0649  BP: (!) 156/76  Pulse: 73  Resp: 15  Temp: 36.6 C  SpO2: [id number]    Complications: No apparent anesthesia complications

## 2019-11-13 NOTE — Progress Notes (Signed)
Denise Steele worked with physical therapy today, but was somewhat limited by weakness and hypotension. She will benefit from staying overnight in order to continue working with therapy towards safe discharge home tomorrow. She feels comfortable with this plan.   Griffith Citron, PA-C Orthopedic Surgery [facility] [place] [phone]

## 2019-11-13 NOTE — Anesthesia Procedure Notes (Addendum)
Spinal  Patient location during procedure: OR Start time: 11/13/2019 8:17 AM End time: 11/13/2019 8:20 AM Reason for block: at surgeon's request Staffing Performed: resident/CRNA  Resident/CRNA: Anne Fu, CRNA Preanesthetic Checklist Completed: patient identified, IV checked, site marked, risks and benefits discussed, surgical consent, monitors and equipment checked, pre-op evaluation and timeout performed Spinal Block Patient position: sitting Prep: DuraPrep Patient monitoring: heart rate, continuous pulse ox and blood pressure Approach: midline Location: L2-3 Injection technique: single-shot Needle Needle type: Pencan  Needle gauge: 24 G Needle length: 9 cm Assessment Sensory level: T6 Additional Notes  Functioning IV was confirmed and monitors were applied. Expiration date of kit checked and confirmed. Sterile prep and drape, including hand hygiene and sterile gloves were used. The patient was positioned and the spine was prepped. The skin was anesthetized with lidocaine.  Free flow of clear CSF was obtained prior to injecting local anesthetic into the CSF X 1 attempt.  The spinal needle aspirated freely following injection.  The needle was carefully withdrawn. Patient tolerated procedure well, without complications. Loss of motor and sensory on exam post injection.

## 2019-11-13 NOTE — Discharge Instructions (Addendum)
Gaynelle Arabian, MD Total Joint Specialist [facility] [place] [address], [address] [place], [place] [address] [phone]       ANTERIOR APPROACH TOTAL HIP REPLACEMENT POSTOPERATIVE DIRECTIONS FOR SAME DAY DISCHARGE    Hip Rehabilitation, Guidelines Following Surgery  The results of a hip operation are greatly improved after range of motion and muscle strengthening exercises. Follow all safety measures which are given to protect your hip. If any of these exercises cause increased pain or swelling in your joint, decrease the amount until you are comfortable again. Then slowly increase the exercises. Call your caregiver if you have problems or questions.   BLOOD CLOT PREVENTION . Take a 325 mg Aspirin two times a day for three weeks following surgery. Then resume one 81 mg Aspirin once a day. Dennis Bast may resume your vitamins/supplements upon discharge from the hospital. . Do not take any NSAIDs (Advil, Aleve, Ibuprofen, Meloxicam, etc.) until you have discontinued the 325 mg Aspirin.  HOME CARE INSTRUCTIONS  . Remove items at home which could result in a fall. This includes throw rugs or furniture in walking pathways.   ICE to the affected hip as frequently as 20-30 minutes an hour and then as needed for pain and swelling.  Continue to use ice on the hip for pain and swelling from surgery. You may notice swelling that will progress down to the foot and ankle.  This is normal after surgery.  Elevate the leg when you are not up walking on it.    Continue to use the breathing machine which will help keep your temperature down.  It is common for your temperature to cycle up and down following surgery, especially at night when you are not up moving around and exerting yourself.  The breathing machine keeps your lungs expanded and your temperature down.  DIET You may resume your previous home diet once you are discharged from the hospital.  DRESSING / WOUND CARE /  SHOWERING . You have an adhesive waterproof bandage over the incision. Leave this in place until your first follow-up appointment. Once you remove this you will not need to place another bandage.  . You may begin showering 3 days following surgery, but do not submerge the incision under water.  ACTIVITY . For the first 3-5 days it is important to rest and keep the operative leg elevated. You should, as a general rule, rest for 50 minutes per hour and get up and walk/stretch for 10 minutes per hour. After 5 days you can slowly increase activity as tolerated. Marland Kitchen Perform the exercises you were provided twice a day for about 15-20 minutes each session. Begin these 2 days after surgery.  WEIGHT BEARING . You may bear weight as tolerated on your operative leg using a walker for assistance . Walk with your walker as instructed. Use the walker until you are comfortable transitioning to a cane. Walk with the cane in the opposite hand of the operative leg. You may discontinue the cane once you are comfortable and walking steadily.  POSTOPERATIVE CONSTIPATION PROTOCOL Constipation - defined medically as fewer than three stools per week and severe constipation as less than one stool per week.  One of the most common issues patients have following surgery is constipation.  Even if you have a regular bowel pattern at home, your normal regimen is likely to be disrupted due to multiple reasons following surgery.  Combination of anesthesia, postoperative narcotics, change in appetite and fluid intake all can  affect your bowels.  In order to avoid complications following surgery, here are some recommendations in order to help you during your recovery period.  Colace (docusate) - Pick up an over-the-counter form of Colace or another stool softener and take twice a day as long as you are requiring postoperative pain medications.  Take with a full glass of water daily.  If you experience loose stools or diarrhea, hold  the colace until you stool forms back up.  If your symptoms do not get better within 1 week or if they get worse, check with your doctor  MiraLax (polyethylene glycol) - Pick up over-the-counter to have on hand.  MiraLax is a solution that will increase the amount of water in your bowels to assist with bowel movements.  Take as directed and can mix with a glass of water, juice, soda, coffee, or tea.  Take if you go more than two days without a movement. Do not use MiraLax more than once per day. Call your doctor if you are still constipated or irregular after using this medication for 5 days in a row.  If you continue to have problems with postoperative constipation, please contact the office for further assistance and recommendations.  If you experience "the worst abdominal pain ever" or develop nausea or vomiting, please contact the office immediatly for further recommendations for treatment.  ITCHING  If you experience itching with your medications, try taking only a single pain pill, or even half a pain pill at a time.  You can also use Benadryl over the counter for itching or also to help with sleep.   TED HOSE STOCKINGS Wear the elastic stockings on both legs for three weeks following surgery during the day but you may remove then at night for sleeping.  MEDICATIONS See your medication summary on the "After Visit Summary" that the nursing staff will review with you prior to discharge.  You may have some home medications which will be placed on hold until you complete the course of blood thinner medication.  It is important for you to complete the blood thinner medication as prescribed by your surgeon.  Continue your approved medications as instructed at time of discharge.  PRECAUTIONS If you experience chest pain or shortness of breath - call 911 immediately for transfer to the hospital emergency department.  If you develop a fever greater that 101 F, purulent drainage from wound, increased  redness or drainage from wound, foul odor from the wound/dressing, or calf pain - CONTACT YOUR SURGEON.                                                   FOLLOW-UP APPOINTMENTS Make sure you keep all of your appointments after your operation with your surgeon and caregivers. You should call the office at the above phone number and make an appointment for approximately two weeks after the date of your surgery or on the date instructed by your surgeon outlined in the "After Visit Summary".  MAKE SURE YOU:  . Understand these instructions.  . Get help right away if you are not doing well or get worse.   DENTAL ANTIBIOTICS:  In most cases prophylactic antibiotics for Dental procdeures after total joint surgery are not necessary.  Exceptions are as follows:  1. History of prior total joint infection  2. Severely immunocompromised (Organ  Transplant, cancer chemotherapy, Rheumatoid biologic meds such as [place])  3. Poorly controlled diabetes (A1C &gt; 8.0, blood glucose over 200)  If you have one of these conditions, contact your surgeon for an antibiotic prescription, prior to your dental procedure.   Pick up stool softner and laxative for home use following surgery while on pain medications. May shower starting three days after surgery. Continue to use ice for pain and swelling after surgery. Do not use any lotions or creams on the incision until instructed by your surgeon.

## 2019-11-13 NOTE — Plan of Care (Signed)
Plan of care 

## 2019-11-13 NOTE — Anesthesia Postprocedure Evaluation (Signed)
Anesthesia Post Note  Patient: Denise Steele  Procedure(s) Performed: TOTAL HIP ARTHROPLASTY ANTERIOR APPROACH (Right Hip)     Patient location during evaluation: PACU Anesthesia Type: Spinal Level of consciousness: awake and alert and oriented Pain management: pain level controlled Vital Signs Assessment: post-procedure vital signs reviewed and stable Respiratory status: spontaneous breathing, nonlabored ventilation and respiratory function stable Cardiovascular status: blood pressure returned to baseline Postop Assessment: no apparent nausea or vomiting, spinal receding, no headache and no backache Anesthetic complications: no    Last Vitals:  Vitals:   11/13/19 1100 11/13/19 1120  BP: (!) 107/59 118/69  Pulse: (!) 51 (!) 48  Resp: 12 12  Temp: (!) 36.3 C   SpO2: 100% 99%    Last Pain:  Vitals:   11/13/19 1120  TempSrc:   PainSc: [place]

## 2019-11-13 NOTE — Interval H&P Note (Signed)
History and Physical Interval Note:  11/13/2019 7:46 AM  [facility]  has presented today for surgery, with the diagnosis of right hip osteoarthritis.  The various methods of treatment have been discussed with the patient and family. After consideration of risks, benefits and other options for treatment, the patient has consented to  Procedure(s) with comments: [place] (Right) - 149min as a surgical intervention.  The patient's history has been reviewed, patient examined, no change in status, stable for surgery.  I have reviewed the patient's chart and labs.  Questions were answered to the patient's satisfaction.     Pilar Plate Junaid Wurzer

## 2019-11-13 NOTE — Evaluation (Signed)
Physical Therapy Evaluation Patient Details Name: Denise Steele MRN: [id number] DOB: 05/26/1956 Today's Date: 11/13/2019   History of Present Illness  Patient is 64 y.o. female s/p Rt THA anterior approach on 11/13/19 with PMH significant for HTN, OA, kidney stones.  Clinical Impression  Denise Steele is a 64 y.o. female POD 0 s/p Rt THA. Patient reports independence with mobility at baseline. Patient is now limited by functional impairments (see PT problem list below) and requires min assist for transfers and gait with RW. Patient was able to ambulate ~45 feet with RW and min assist. She became lightheaded and dizzy after walking and required seated rest. BP dropped from 157/84 to 109/56. Further gait and stair mobility deferred. Patient instructed in exercise to facilitate circulation. Patient will benefit from continued skilled PT interventions to address impairments and progress towards PLOF. Acute PT will follow to progress mobility and stair training in preparation for safe discharge home.     Follow Up Recommendations Follow surgeon's recommendation for DC plan and follow-up therapies    Equipment Recommendations  Rolling walker with 5" wheels    Recommendations for Other Services       Precautions / Restrictions Precautions Precautions: Fall Restrictions Weight Bearing Restrictions: No Other Position/Activity Restrictions: WBAT      Mobility  Bed Mobility Overal bed mobility: Needs Assistance Bed Mobility: Supine to Sit;Sit to Supine     Supine to sit: Min guard Sit to supine: Min assist   General bed mobility comments: cues for use of gait belt to assist with Rt LE mobility to move LE off EOB. Pt required extra time and some extra effort. Assist requried to raise LE into bed.   Transfers Overall transfer level: Needs assistance Equipment used: Rolling walker (2 wheeled) Transfers: Sit to/from [facility] Sit to Stand: Min guard Stand pivot transfers:  Min assist       General transfer comment: Cues for safe hand placement and technique with RW, guarding for safery to stand from EOB, light assist to rise from toilet.   Ambulation/Gait Ambulation/Gait assistance: Min assist Gait Distance (Feet): 45 [id number]) Assistive device: Rolling walker (2 wheeled) Gait Pattern/deviations: Step-to pattern;Decreased step length - right;Decreased step length - left;Decreased stride length;Decreased weight shift to right;Shuffle;Trunk flexed Gait velocity: decreased   General Gait Details: cues for safe step pattern and proximity to RW. Assist to position walker intermittently and pt taking short steps. After ambulation out of the bathroom pt became lightheaded, dizzy, nauseous, and seated rest provided. Pt noted to be hypotensive with BP decreased from 157/84 in bed to 109/56 after gait.   Stairs            Wheelchair Mobility    Modified Rankin (Stroke Patients Only)       Balance Overall balance assessment: Needs assistance Sitting-balance support: Feet supported Sitting balance-Leahy Scale: Good     Standing balance support: During functional activity;Bilateral upper extremity supported Standing balance-Leahy Scale: Fair            Pertinent Vitals/Pain Pain Assessment: 0-10 Pain Score: 3  Pain Location: Rt hip Pain Descriptors / Indicators: Aching;Discomfort Pain Intervention(s): Limited activity within patient's tolerance;Monitored during session;Repositioned    Home Living Family/patient expects to be discharged to:: Private residence Living Arrangements: Spouse/significant other Available Help at Discharge: Family Type of Home: House Home Access: Stairs to enter Entrance Stairs-Rails: None Entrance Stairs-Number of Steps: 2 Home Layout: Two level;Laundry or work area in basement;Bed/bath [employer] on [address]: Museum/gallery curator  bars - tub/shower;Shower seat;Shower seat - built in;Walker - 2 wheels;Cane  - single point;Bedside commode      Prior Function Level of Independence: Independent           Hand Dominance   Dominant Hand: Right    Extremity/Trunk Assessment   Upper Extremity Assessment Upper Extremity Assessment: Overall WFL for tasks assessed    Lower Extremity Assessment Lower Extremity Assessment: Overall WFL for tasks assessed;RLE deficits/detail RLE Deficits / Details: good quad activation RLE Sensation: WNL RLE Coordination: WNL    Cervical / Trunk Assessment Cervical / Trunk Assessment: Normal  Communication   Communication: No difficulties  Cognition Arousal/Alertness: Awake/alert Behavior During Therapy: WFL for tasks assessed/performed Overall Cognitive Status: Within Functional Limits for tasks assessed             General Comments      Exercises Total Joint Exercises Ankle Circles/Pumps: AROM;Both;20 reps;Supine   Assessment/Plan    PT Assessment Patient needs continued PT services  PT Problem List Decreased range of motion;Decreased strength;Decreased activity tolerance;Decreased balance;Decreased mobility;Decreased knowledge of use of DME;Decreased knowledge of precautions       PT Treatment Interventions DME instruction;Gait training;Stair training;Functional mobility training;Therapeutic activities;Therapeutic exercise;Balance training;Patient/family education    PT Goals (Current goals can be found in the Care Plan section)  Acute Rehab PT Goals Patient Stated Goal: to be safe and get walking independently again PT Goal Formulation: With patient Time For Goal Achievement: 11/20/19 Potential to Achieve Goals: Good    Frequency 7X/week    AM-PAC PT "6 Clicks" Mobility  Outcome Measure Help needed turning from your back to your side while in a flat bed without using bedrails?: None Help needed moving from lying on your back to sitting on the side of a flat bed without using bedrails?: A Little Help needed moving to and from a  bed to a chair (including a wheelchair)?: A Little Help needed standing up from a chair using your arms (e.g., wheelchair or bedside chair)?: A Little Help needed to walk in hospital room?: A Little Help needed climbing 3-5 steps with a railing? : A Lot 6 Click Score: 18    End of Session Equipment Utilized During Treatment: Gait belt Activity Tolerance: Treatment limited secondary to medical complications (Comment)(hypotension after gait) Patient left: in bed Nurse Communication: Mobility status PT Visit Diagnosis: Muscle weakness (generalized) (M62.81);Difficulty in walking, not elsewhere classified (R26.2)    Time: [id number] PT Time Calculation (min) (ACUTE ONLY): 27 min   Charges:   PT Evaluation $PT Eval Low Complexity: 1 Low PT Treatments $Gait Training: 8-22 mins        Verner Mould, DPT Physical Therapist with [facility] [phone]  11/13/2019 5:54 PM

## 2019-11-13 NOTE — Op Note (Signed)
OPERATIVE REPORT- TOTAL HIP ARTHROPLASTY   PREOPERATIVE DIAGNOSIS: Osteoarthritis of the Right hip.   POSTOPERATIVE DIAGNOSIS: Osteoarthritis of the Right  hip.   PROCEDURE: Right total hip arthroplasty, anterior approach.   SURGEON: Gaynelle Arabian, MD   ASSISTANT: Griffith Citron, PA-C  ANESTHESIA:  Spinal  ESTIMATED BLOOD LOSS:-400 mL    DRAINS: Hemovac x1.   COMPLICATIONS: None   CONDITION: PACU - hemodynamically stable.   BRIEF CLINICAL NOTE: Denise Steele is a 64 y.o. female who has advanced end-  stage arthritis of their Right  hip with progressively worsening pain and  dysfunction.The patient has failed nonoperative management and presents for  total hip arthroplasty.   PROCEDURE IN DETAIL: After successful administration of spinal  anesthetic, the traction boots for the [facility] bed were placed on both  feet and the patient was placed onto the [facility] bed, boots placed into the leg  holders. The Right hip was then isolated from the perineum with plastic  drapes and prepped and draped in the usual sterile fashion. ASIS and  greater trochanter were marked and a oblique incision was made, starting  at about 1 cm lateral and 2 cm distal to the ASIS and coursing towards  the anterior cortex of the femur. The skin was cut with a 10 blade  through subcutaneous tissue to the level of the fascia overlying the  tensor fascia lata muscle. The fascia was then incised in line with the  incision at the junction of the anterior third and posterior 2/3rd. The  muscle was teased off the fascia and then the interval between the TFL  and the rectus was developed. The Hohmann retractor was then placed at  the top of the femoral neck over the capsule. The vessels overlying the  capsule were cauterized and the fat on top of the capsule was removed.  A Hohmann retractor was then placed anterior underneath the rectus  femoris to give exposure to the entire anterior capsule. A T-shaped   capsulotomy was performed. The edges were tagged and the femoral head  was identified.       Osteophytes are removed off the superior acetabulum.  The femoral neck was then cut in situ with an oscillating saw. Traction  was then applied to the left lower extremity utilizing the [facility]  traction. The femoral head was then removed. Retractors were placed  around the acetabulum and then circumferential removal of the labrum was  performed. Osteophytes were also removed. Reaming starts at 45 mm to  medialize and  Increased in 2 mm increments to 47 mm. We reamed in  approximately 40 degrees of abduction, 20 degrees anteversion. A 48 mm  pinnacle acetabular shell was then impacted in anatomic position under  fluoroscopic guidance with excellent purchase. We did not need to place  any additional dome screws. A 28 mm neutral + 4 marathon liner was then  placed into the acetabular shell.       The femoral lift was then placed along the lateral aspect of the femur  just distal to the vastus ridge. The leg was  externally rotated and capsule  was stripped off the inferior aspect of the femoral neck down to the  level of the lesser trochanter, this was done with electrocautery. The femur was lifted after this was performed. The  leg was then placed in an extended and adducted position essentially delivering the femur. We also removed the capsule superiorly and the piriformis from the piriformis fossa  to gain excellent exposure of the  proximal femur. Rongeur was used to remove some cancellous bone to get  into the lateral portion of the proximal femur for placement of the  initial starter reamer. The starter broaches was placed  the starter broach  and was shown to go down the center of the canal. Broaching  with the Actis system was then performed starting at size 0  coursing  Up to size 3. A size 3 had excellent torsional and rotational  and axial stability. The trial standard offset neck was then  placed  with a 28 + 5 trial head. The hip was then reduced. We confirmed that  the stem was in the canal both on AP and lateral x-rays. It also has excellent sizing. The hip was reduced with outstanding stability through full extension and full external rotation.. AP pelvis was taken and the leg lengths were measured and found to be equal. Hip was then dislocated again and the femoral head and neck removed. The  femoral broach was removed. Size 3 Actis stem with a standard offset  neck was then impacted into the femur following native anteversion. Has  excellent purchase in the canal. Excellent torsional and rotational and  axial stability. It is confirmed to be in the canal on AP and lateral  fluoroscopic views. The 28 + 5 ceramic head was placed and the hip  reduced with outstanding stability. Again AP pelvis was taken and it  confirmed that the leg lengths were equal. The wound was then copiously  irrigated with saline solution and the capsule reattached and repaired  with Ethibond suture. 30 ml of .25% Bupivicaine was  injected into the capsule and into the edge of the tensor fascia lata as well as subcutaneous tissue. The fascia overlying the tensor fascia lata was then closed with a running #1 V-Loc. Subcu was closed with interrupted 2-0 Vicryl and subcuticular running 4-0 Monocryl. Incision was cleaned  and dried. Steri-Strips and a bulky sterile dressing applied. Hemovac  drain was hooked to suction and then the patient was awakened and transported to  recovery in stable condition.        Please note that a surgical assistant was a medical necessity for this procedure to perform it in a safe and expeditious manner. Assistant was necessary to provide appropriate retraction of vital neurovascular structures and to prevent femoral fracture and allow for anatomic placement of the prosthesis.  Gaynelle Arabian, M.D.

## 2019-11-14 ENCOUNTER — Encounter: Payer: Self-pay | Admitting: *Deleted

## 2019-11-14 DIAGNOSIS — M1611 Unilateral primary osteoarthritis, right hip: Secondary | ICD-10-CM | POA: Diagnosis not present

## 2019-11-14 LAB — CBC
HCT: 38.6 % (ref 36.0–46.0)
Hemoglobin: 12.5 g/dL (ref 12.0–15.0)
MCH: 30.7 pg (ref 26.0–34.0)
MCHC: 32.4 g/dL (ref 30.0–36.0)
MCV: 94.8 fL (ref 80.0–100.0)
Platelets: 207 10*3/uL (ref 150–400)
RBC: 4.07 MIL/uL (ref 3.87–5.11)
RDW: 12.1 % (ref 11.5–15.5)
WBC: 14.8 10*3/uL — ABNORMAL HIGH (ref 4.0–10.5)
nRBC: 0 % (ref 0.0–0.2)

## 2019-11-14 LAB — BASIC METABOLIC PANEL
Anion gap: 6 (ref 5–15)
BUN: 10 mg/dL (ref 8–23)
CO2: 25 mmol/L (ref 22–32)
Calcium: 8.7 mg/dL — ABNORMAL LOW (ref 8.9–10.3)
Chloride: 108 mmol/L (ref 98–111)
Creatinine, Ser: 0.62 mg/dL (ref 0.44–1.00)
GFR calc Af Amer: >60 mL/min (ref >60)
GFR calc non Af Amer: >60 mL/min (ref >60)
Glucose, Bld: 132 mg/dL — ABNORMAL HIGH (ref 70–99)
Potassium: 4.5 mmol/L (ref 3.5–5.1)
Sodium: 139 mmol/L (ref 135–145)

## 2019-11-14 NOTE — Progress Notes (Signed)
   Subjective: 1 Day Post-Op Procedure(s) (LRB): TOTAL HIP ARTHROPLASTY ANTERIOR APPROACH (Right) Patient reports pain as mild.   Patient seen in rounds by Dr. Wynelle Link. Patient is well, and has had no acute complaints or problems other than discomfort in the right hip. No acute events overnight. Voiding without difficulty, positive flatus. Patient states she is ready to go home today. Denies CP, SHOB, N/V.  We will continue therapy today.   Objective: Vital signs in last 24 hours: Temp:  [97.3 F (36.3 C)-98.2 F (36.8 C)] 97.5 F (36.4 C) (04/15 0504) Pulse Rate:  [48-80] 65 (04/15 0504) Resp:  [11-18] 16 (04/15 0504) BP: (107-164)/(56-84) 155/68 (04/15 0504) SpO2:  [96 %-100 %] 96 % (04/15 0504) Weight:  [87 kg] 87 kg (04/14 1940)  Intake/Output from previous day:  Intake/Output Summary (Last 24 hours) at 11/14/2019 0755 Last data filed at 11/14/2019 0600 Gross per 24 hour  Intake 3995.33 ml  Output 3900 ml  Net 95.33 ml     Intake/Output this shift: No intake/output data recorded.  Labs: Recent Labs    11/14/19 0318  HGB 12.5   Recent Labs    11/14/19 0318  WBC 14.8*  RBC 4.07  HCT 38.6  PLT 207   Recent Labs    11/14/19 0318  NA 139  K 4.5  CL 108  CO2 25  BUN 10  CREATININE 0.62  GLUCOSE 132*  CALCIUM 8.7*   No results for input(s): LABPT, INR in the last 72 hours.  Exam: General - Patient is Alert and Oriented Extremity - Neurologically intact Sensation intact distally Intact pulses distally Dorsiflexion/Plantar flexion intact Dressing - dressing C/D/I Motor Function - intact, moving foot and toes well on exam.   Past Medical History:  Diagnosis Date  . Arthritis   . History of kidney stones   . Hypertension   . Medical history non-contributory   . PONV (postoperative nausea and vomiting)     Assessment/Plan: 1 Day Post-Op Procedure(s) (LRB): TOTAL HIP ARTHROPLASTY ANTERIOR APPROACH (Right) Principal Problem:   OA  (osteoarthritis) of hip Active Problems:   Status post right hip replacement  Estimated body mass index is 30.96 kg/m as calculated from the following:   Height as of this encounter: 5\' 6"  (1.676 m).   Weight as of this encounter: 87 kg. Advance diet Up with therapy D/C IV fluids  DVT Prophylaxis - Aspirin Weight bearing as tolerated. D/C O2 and pulse ox and try on room air.  Plan is to go Home after hospital stay. Plan for discharge today following 1 session of PT as long as she is meeting her goals. She will leave the aquacel dressing in place until follow up. Follow up in the office in 2 weeks.   Griffith Citron, PA-C Orthopedic Surgery [phone] 11/14/2019, 7:55 AM

## 2019-11-14 NOTE — TOC Progression Note (Signed)
Transition of Care [employer] [facility]) - Progression Note    Patient Details  Name: Denise Steele MRN: [id number] Date of Birth: May 28, 1956  Transition of Care [facility]) CM/SW [place], LCSW Phone Number: 11/14/2019, 10:23 AM  Clinical Narrative:    Therapy Plan: HEP Patient has DME- RW, High Toilet seats and bedside commode     Barriers to Discharge: No Barriers Identified  Expected Discharge Plan and Services           Expected Discharge Date: 11/14/19               DME Arranged: N/A DME Agency: NA       HH Arranged: NA HH Agency: NA         Social Determinants of Health (SDOH) Interventions    Readmission Risk Interventions No flowsheet data found.

## 2019-11-14 NOTE — Progress Notes (Signed)
RN reviewed discharge instructions with patient and family.   All questions answered.   Paperwork given. Prescriptions sent to pharmacy by PA.    NT accompanied patient to family car with all belongings.     [place], Therapist, sports

## 2019-11-14 NOTE — Progress Notes (Signed)
Physical Therapy Treatment Patient Details Name: Denise Steele MRN: [id number] DOB: [birth year] Today's Date: 11/14/2019    History of Present Illness Patient is 64 y.o. female s/p Rt THA anterior approach on 11/13/19 with PMH significant for HTN, OA, kidney stones.    PT Comments    Pt is progressing well with mobility and is ready to DC home from PT standpoint. She ambulated 140' with RW, completed stair training, and demonstrates good understanding of HEP. No dizziness/hypotension today.   Follow Up Recommendations  Follow surgeon's recommendation for DC plan and follow-up therapies     Equipment Recommendations  Rolling walker with 5" wheels    Recommendations for Other Services       Precautions / Restrictions Precautions Precautions: Fall Restrictions Weight Bearing Restrictions: No Other Position/Activity Restrictions: WBAT    Mobility  Bed Mobility Overal bed mobility: Needs Assistance Bed Mobility: Supine to Sit     Supine to sit: Modified independent (Device/Increase time)     General bed mobility comments: instructed pt to self assist RLE with LLE for OOB, no physical assist needed  Transfers Overall transfer level: Needs assistance Equipment used: Rolling walker (2 wheeled) Transfers: Sit to/from Stand Sit to Stand: Supervision         General transfer comment: Cues for safe hand placement and technique with RW  Ambulation/Gait Ambulation/Gait assistance: Supervision Gait Distance (Feet): 140 Feet Assistive device: Rolling walker (2 wheeled) Gait Pattern/deviations: Step-to pattern;Decreased step length - right;Decreased step length - left;Decreased stride length;Decreased weight shift to right Gait velocity: decreased   General Gait Details: good sequencing, no loss of balance   Stairs Stairs: Yes Stairs assistance: Min assist Stair Management: No rails;Backwards;With walker Number of Stairs: 2 General stair comments: 2 steps x 2 trials, min  A to manage RW, VCs sequencing   Wheelchair Mobility    Modified Rankin (Stroke Patients Only)       Balance Overall balance assessment: Needs assistance Sitting-balance support: Feet supported Sitting balance-Leahy Scale: Good     Standing balance support: During functional activity;Bilateral upper extremity supported Standing balance-Leahy Scale: Fair                              Cognition Arousal/Alertness: Awake/alert Behavior During Therapy: WFL for tasks assessed/performed Overall Cognitive Status: Within Functional Limits for tasks assessed                                        Exercises Total Joint Exercises Ankle Circles/Pumps: AROM;Both;20 reps;Supine Quad Sets: AROM;Both;5 reps;Supine Short Arc Quad: AROM;Right;10 reps;Supine Heel Slides: AAROM;Right;10 reps;Supine Hip ABduction/ADduction: AAROM;Right;10 reps;Supine Long Arc Quad: AROM;Right;10 reps;Seated    General Comments        Pertinent Vitals/Pain Pain Score: 3  Pain Location: R thigh Pain Descriptors / Indicators: Spasm Pain Intervention(s): Limited activity within patient's tolerance;Monitored during session;Premedicated before session;Ice applied    Home Living                      Prior Function            PT Goals (current goals can now be found in the care plan section) Acute Rehab PT Goals Patient Stated Goal: be able to get up from a squat, play with grandkids PT Goal Formulation: With patient Time For Goal Achievement: 11/20/19 Potential to Achieve Goals: Good  Progress towards PT goals: Goals met/education completed, patient discharged from PT    Frequency    7X/week      PT Plan Current plan remains appropriate    Co-evaluation              AM-PAC PT "6 Clicks" Mobility   Outcome Measure  Help needed turning from your back to your side while in a flat bed without using bedrails?: None Help needed moving from lying on  your back to sitting on the side of a flat bed without using bedrails?: A Little Help needed moving to and from a bed to a chair (including a wheelchair)?: A Little Help needed standing up from a chair using your arms (e.g., wheelchair or bedside chair)?: A Little Help needed to walk in hospital room?: A Little Help needed climbing 3-5 steps with a railing? : A Little 6 Click Score: 19    End of Session Equipment Utilized During Treatment: Gait belt Activity Tolerance: Patient tolerated treatment well Patient left: in chair;with call bell/phone within reach;with chair alarm set Nurse Communication: Mobility status PT Visit Diagnosis: Muscle weakness (generalized) (M62.81);Difficulty in walking, not elsewhere classified (R26.2);Pain Pain - Right/Left: Right Pain - part of body: Hip     Time: 1021-1050 PT Time Calculation (min) (ACUTE ONLY): 29 min  Charges:  $Gait Training: 8-22 mins $Therapeutic Exercise: 8-22 mins                     Blondell Reveal Kistler PT 11/14/2019  Acute Rehabilitation Services Pager [phone] Office [phone]

## 2020-08-07 DIAGNOSIS — M1612 Unilateral primary osteoarthritis, left hip: Secondary | ICD-10-CM | POA: Diagnosis not present

## 2020-08-07 DIAGNOSIS — M25552 Pain in left hip: Secondary | ICD-10-CM | POA: Diagnosis not present

## 2020-10-01 DIAGNOSIS — M25559 Pain in unspecified hip: Secondary | ICD-10-CM | POA: Diagnosis not present

## 2020-10-01 DIAGNOSIS — Z87891 Personal history of nicotine dependence: Secondary | ICD-10-CM | POA: Diagnosis not present

## 2020-10-01 DIAGNOSIS — Z299 Encounter for prophylactic measures, unspecified: Secondary | ICD-10-CM | POA: Diagnosis not present

## 2020-10-01 DIAGNOSIS — I10 Essential (primary) hypertension: Secondary | ICD-10-CM | POA: Diagnosis not present

## 2020-10-09 NOTE — Patient Instructions (Addendum)
DUE TO COVID-19 ONLY ONE VISITOR IS ALLOWED TO COME WITH YOU AND STAY IN THE WAITING ROOM ONLY DURING PRE OP AND PROCEDURE DAY OF SURGERY. THE 1 VISITOR  MAY VISIT WITH YOU AFTER SURGERY IN YOUR PRIVATE ROOM DURING VISITING HOURS ONLY!  YOU NEED TO HAVE A COVID 19 TEST ON_3/19______ @__11 :30_____, THIS TEST MUST BE DONE BEFORE SURGERY,  COVID TESTING SITE [place] [address], IT IS ON THE RIGHT GOING OUT [address] APPROXIMATELY  2 MINUTES PAST [employer] ON THE RIGHT. ONCE YOUR COVID TEST IS COMPLETED,  PLEASE BEGIN THE QUARANTINE INSTRUCTIONS AS OUTLINED IN YOUR HANDOUT.                Cristiana Yochim   Your procedure is scheduled on: 10/21/20   Report to [facility] Main  Entrance   Report to admitting at 12:00 PM     Call this number if you have problems the morning of surgery [place], NO CHEWING GUM [place].   No food after midnight.    You may have clear liquid until 11:30 PM     CLEAR LIQUID DIET   Foods Allowed                                                                     Foods Excluded  Coffee and tea, regular and decaf                             liquids that you cannot  Plain Jell-O any favor except red or purple                                           see through such as: Fruit ices (not with fruit pulp)                                     milk, soups, orange juice  Iced Popsicles                                    All solid food Carbonated beverages, regular and diet                                    Cranberry, grape and apple juices Sports drinks like Gatorade Lightly seasoned clear broth or consume(fat free) Sugar, honey syrup        At11:00 AM drink pre surgery drink.   Nothing by mouth after 11:30  PM    Take these medicines the morning of surgery with A SIP OF WATER: none                                 You may not  have  any metal on your body including hair pins and              piercings  Do not wear jewelry, make-up, lotions, powders or perfumes, deodorant             Do not wear nail polish on your fingernails.  Do not shave  48 hours prior to surgery.     Do not bring valuables to the hospital. [place].  Contacts, dentures or bridgework may not be worn into surgery.       Special Instructions: N/A              Please read over the following fact sheets you were given: _____________________________________________________________________             [facility] - Preparing for Surgery Before surgery, you can play an important role.  Because skin is not sterile, your skin needs to be as free of germs as possible.  You can reduce the number of germs on your skin by washing with CHG (chlorahexidine gluconate) soap before surgery.  CHG is an antiseptic cleaner which kills germs and bonds with the skin to continue killing germs even after washing. Please DO NOT use if you have an allergy to CHG or antibacterial soaps.  If your skin becomes reddened/irritated stop using the CHG and inform your nurse when you arrive at Short Stay. Do not shave (including legs and underarms) for at least 48 hours prior to the first CHG shower.   Please follow these instructions carefully:  1.  Shower with CHG Soap the night before surgery and the  morning of Surgery.  2.  If you choose to wash your hair, wash your hair first as usual with your  normal  shampoo.  3.  After you shampoo, rinse your hair and body thoroughly to remove the  shampoo.                                     4.  Use CHG as you would any other liquid soap.  You can apply chg directly  to the skin and wash                       Gently with a scrungie or clean washcloth.  5.  Apply the CHG Soap to your body ONLY FROM THE NECK DOWN.   Do not use on face/ open                           Wound or open sores.  Avoid contact with eyes, ears mouth and genitals (private parts).                       Wash face,  Genitals (private parts) with your normal soap.             6.  Wash thoroughly, paying special attention to the area where your surgery  will be performed.  7.  Thoroughly rinse your body with warm water from the neck down.  8.  DO NOT shower/wash with your normal soap after using and rinsing off  the CHG Soap.             9.  Pat yourself dry with a clean towel.            10.  Wear clean pajamas.            11.  Place clean sheets on your bed the night of your first shower and do not  sleep with pets. Day of Surgery : Do not apply any lotions/deodorants the morning of surgery.  Please wear clean clothes to the hospital/surgery center.  FAILURE TO FOLLOW THESE INSTRUCTIONS MAY RESULT IN THE CANCELLATION OF YOUR SURGERY PATIENT SIGNATURE_________________________________  NURSE SIGNATURE__________________________________  ________________________________________________________________________   Adam Phenix  An incentive spirometer is a tool that can help keep your lungs clear and active. This tool measures how well you are filling your lungs with each breath. Taking long deep breaths may help reverse or decrease the chance of developing breathing (pulmonary) problems (especially infection) following:  A long period of time when you are unable to move or be active. BEFORE THE PROCEDURE   If the spirometer includes an indicator to show your best effort, your nurse or respiratory therapist will set it to a desired goal.  If possible, sit up straight or lean slightly forward. Try not to slouch.  Hold the incentive spirometer in an upright position. INSTRUCTIONS FOR USE  1. Sit on the edge of your bed if possible, or sit up as far as you can in bed or on a chair. 2. Hold the incentive spirometer in an upright position. 3. Breathe out normally. 4. Place the mouthpiece in your  mouth and seal your lips tightly around it. 5. Breathe in slowly and as deeply as possible, raising the piston or the ball toward the top of the column. 6. Hold your breath for 3-5 seconds or for as long as possible. Allow the piston or ball to fall to the bottom of the column. 7. Remove the mouthpiece from your mouth and breathe out normally. 8. Rest for a few seconds and repeat Steps 1 through 7 at least 10 times every 1-2 hours when you are awake. Take your time and take a few normal breaths between deep breaths. 9. The spirometer may include an indicator to show your best effort. Use the indicator as a goal to work toward during each repetition. 10. After each set of 10 deep breaths, practice coughing to be sure your lungs are clear. If you have an incision (the cut made at the time of surgery), support your incision when coughing by placing a pillow or rolled up towels firmly against it. Once you are able to get out of bed, walk around indoors and cough well. You may stop using the incentive spirometer when instructed by your caregiver.  RISKS AND COMPLICATIONS  Take your time so you do not get dizzy or light-headed.  If you are in pain, you may need to take or ask for pain medication before doing incentive spirometry. It is harder to take a deep breath if you are having pain. AFTER USE  Rest and breathe slowly and easily.  It can be helpful to keep track of a log of your progress. Your caregiver can provide you with a simple table to help with this. If you are using the spirometer at home, follow these instructions: [place] IF:   You are having difficultly using the spirometer.  You have trouble using the spirometer as often as instructed.  Your pain medication is not giving enough relief while using the spirometer.  You develop fever of  100.5 F (38.1 C) or higher. SEEK IMMEDIATE MEDICAL CARE IF:   You cough up bloody sputum that had not been present before.  You  develop fever of 102 F (38.9 C) or greater.  You develop worsening pain at or near the incision site. MAKE SURE YOU:   Understand these instructions.  Will watch your condition.  Will get help right away if you are not doing well or get worse. Document Released: 11/28/2006 Document Revised: 10/10/2011 Document Reviewed: 01/29/2007 [facility] Patient Information 2014 [place], [place].   ________________________________________________________________________

## 2020-10-12 ENCOUNTER — Encounter (HOSPITAL_COMMUNITY)
Admission: RE | Admit: 2020-10-12 | Discharge: 2020-10-12 | Disposition: A | Discharge status: Home / Self Care | Payer: Medicare Other | Source: Ambulatory Visit | Attending: Orthopedic Surgery | Admitting: Orthopedic Surgery

## 2020-10-12 ENCOUNTER — Encounter (HOSPITAL_COMMUNITY): Payer: Self-pay

## 2020-10-12 ENCOUNTER — Other Ambulatory Visit: Payer: Self-pay

## 2020-10-12 DIAGNOSIS — Z01812 Encounter for preprocedural laboratory examination: Secondary | ICD-10-CM | POA: Insufficient documentation

## 2020-10-12 LAB — PROTIME-INR
INR: 1 (ref 0.8–1.2)
Prothrombin Time: 12.6 seconds (ref 11.4–15.2)

## 2020-10-12 LAB — CBC
HCT: 45.4 % (ref 36.0–46.0)
Hemoglobin: 14.8 g/dL (ref 12.0–15.0)
MCH: 30.8 pg (ref 26.0–34.0)
MCHC: 32.6 g/dL (ref 30.0–36.0)
MCV: 94.4 fL (ref 80.0–100.0)
Platelets: 239 10*3/uL (ref 150–400)
RBC: 4.81 MIL/uL (ref 3.87–5.11)
RDW: 12.5 % (ref 11.5–15.5)
WBC: 5 10*3/uL (ref 4.0–10.5)
nRBC: 0 % (ref 0.0–0.2)

## 2020-10-12 LAB — COMPREHENSIVE METABOLIC PANEL
ALT: 19 U/L (ref 0–44)
AST: 19 U/L (ref 15–41)
Albumin: 4.3 g/dL (ref 3.5–5.0)
Alkaline Phosphatase: 60 U/L (ref 38–126)
Anion gap: 10 (ref 5–15)
BUN: 20 mg/dL (ref 8–23)
CO2: 23 mmol/L (ref 22–32)
Calcium: 9.7 mg/dL (ref 8.9–10.3)
Chloride: 107 mmol/L (ref 98–111)
Creatinine, Ser: 0.75 mg/dL (ref 0.44–1.00)
GFR, Estimated: >60 mL/min (ref >60)
Glucose, Bld: 89 mg/dL (ref 70–99)
Potassium: 4.8 mmol/L (ref 3.5–5.1)
Sodium: 140 mmol/L (ref 135–145)
Total Bilirubin: 0.4 mg/dL (ref 0.3–1.2)
Total Protein: 7.5 g/dL (ref 6.5–8.1)

## 2020-10-12 LAB — APTT: aPTT: 27 seconds (ref 24–36)

## 2020-10-12 LAB — SURGICAL PCR SCREEN
MRSA, PCR: NEGATIVE
Staphylococcus aureus: NEGATIVE

## 2020-10-12 NOTE — Progress Notes (Signed)
COVID Vaccine Completed:Yes Date COVID Vaccine completed:10/21/19-booster 07/05/20 COVID vaccine manufacturer:    Moderna     PCP - Dr. Anthonette Legato Cardiologist - no  Chest x-ray - no EKG - 11/05/19-epic Stress Test - no ECHO - no Cardiac Cath - no Pacemaker/ICD device last checked:NA  Sleep Study -no  CPAP -   Fasting Blood Sugar - NA Checks Blood Sugar _____ times a day  Blood Thinner Instructions:ASA 81/ Dr. Manuella Ghazi Aspirin Instructions:Stop 7 days prior to DOS/ aluisio Last Dose:10/14/20  Anesthesia review:   Patient denies shortness of breath, fever, cough and chest pain at PAT appointment yes  Patient verbalized understanding of instructions that were given to them at the PAT appointment. Patient was also instructed that they will need to review over the PAT instructions again at home before surgery.yes Pt climbs stairs slowly because of pain but report no SOB doing housework or with ADLs

## 2020-10-14 NOTE — H&P (Signed)
TOTAL HIP ADMISSION H&P  Patient is admitted for left total hip arthroplasty.  Subjective:  Chief Complaint: Left hip pain  HPI: Denise Steele, 65 y.o. female, has a history of pain and functional disability in the left hip due to arthritis and patient has failed non-surgical conservative treatments for greater than 12 weeks to include corticosteriod injections and activity modification. Onset of symptoms was gradual, starting several years ago with gradually worsening course since that time. The patient noted no past surgery on the left hip. Patient currently rates pain in the left hip at 8 out of 10 with activity. Patient has night pain, worsening of pain with activity and weight bearing, pain that interfers with activities of daily living, pain with passive range of motion and crepitus. Patient has evidence of a large effusion. She has edema in the femoral neck. She has evidence of bone-on-bone superolaterally. There is no evidence of avascular necrosis by imaging studies. This condition presents safety issues increasing the risk of falls. There is no current active infection.  Patient Active Problem List   Diagnosis Date Noted  . OA (osteoarthritis) of hip 11/13/2019  . Status post right hip replacement 11/13/2019    Past Medical History:  Diagnosis Date  . Arthritis    hips and knees  . History of kidney stones   . Hypertension   . Medical history non-contributory   . PONV (postoperative nausea and vomiting)     Past Surgical History:  Procedure Laterality Date  . ANKLE FRACTURE SURGERY Right   . CYSTOSCOPY N/A 05/30/2013   Procedure: CYSTOSCOPY;  Surgeon: Logan Bores, MD;  Location: [facility];  Service: Gynecology;  Laterality: N/A;  . ENDOMETRIAL ABLATION    . FRACTURE SURGERY Right 2011   leg  . kidney stone removal    . LAPAROSCOPIC ASSISTED VAGINAL HYSTERECTOMY N/A 05/30/2013   Procedure: LAPAROSCOPIC ASSISTED VAGINAL HYSTERECTOMY;  Surgeon: Logan Bores, MD;   Location: [facility];  Service: Gynecology;  Laterality: N/A;  abdomen and vagins  . SALPINGOOPHORECTOMY N/A 05/30/2013   Procedure: SALPINGO OOPHORECTOMY;  Surgeon: Logan Bores, MD;  Location: [facility];  Service: Gynecology;  Laterality: N/A;  . TONSILLECTOMY    . TOTAL HIP ARTHROPLASTY Right 11/13/2019   Procedure: TOTAL HIP ARTHROPLASTY ANTERIOR APPROACH;  Surgeon: Gaynelle Arabian, MD;  Location: [facility];  Service: Orthopedics;  Laterality: Right;  146min    Prior to Admission medications   Medication Sig Start Date End Date Taking? Authorizing Provider  aspirin EC 81 MG tablet Take 81 mg by mouth daily. Swallow whole.   Yes [provider]  Biotin 5 MG CAPS Take 5 mg by mouth daily.   Yes [provider]  Cholecalciferol (DIALYVITE VITAMIN D 5000) 125 MCG (5000 UT) capsule Take 5,000 Units by mouth daily.   Yes [provider]  estradiol (ESTRACE) 0.5 MG tablet Take 0.5 mg by mouth daily.   Yes [provider]  fexofenadine (ALLEGRA) 180 MG tablet Take 180 mg by mouth daily.   Yes [provider]  ibuprofen (ADVIL) 200 MG tablet Take 600 mg by mouth every 6 (six) hours as needed for moderate pain.   Yes [provider]  lisinopril (ZESTRIL) 30 MG tablet Take 30 mg by mouth daily. 10/14/19  Yes [provider]  Omega-3 Fatty Acids (OMEGA 3 PO) Take 1 capsule by mouth daily.   Yes [provider]    Allergies  Allergen Reactions  . Penicillins Hives    Did  it involve swelling of the face/tongue/throat, SOB, or low BP? Unknown Did it involve sudden or severe rash/hives, skin peeling, or any reaction on the inside of your mouth or nose? Yes Did you need to seek medical attention at a hospital or doctor's office? No When did it last happen? Childhood reaction. If all above answers are "NO", may proceed with cephalosporin use.  Tolerated Cephalosporin Date: 11/13/19.      Social History   Socioeconomic History  .  Marital status: Married    Spouse name: Not on file  . Number of children: Not on file  . Years of education: Not on file  . Highest education level: Not on file  Occupational History  . Not on file  Tobacco Use  . Smoking status: Former Smoker    Packs/day: 0.25    Years: 15.00    Pack years: 3.75    Types: Cigarettes    Quit date: 1985    Years since quitting: 37.2  . Smokeless tobacco: Never Used  . Tobacco comment: quit over 35 years  Vaping Use  . Vaping Use: Never used  Substance and Sexual Activity  . Alcohol use: Yes    Comment: occ. glass of wine  . Drug use: No  . Sexual activity: Not on file  Other Topics Concern  . Not on file  Social History Narrative  . Not on file   Social Determinants of Health   Financial Resource Strain: Not on file  Food Insecurity: Not on file  Transportation Needs: Not on file  Physical Activity: Not on file  Stress: Not on file  Social Connections: Not on file  Intimate Partner Violence: Not on file    Tobacco Use: Medium Risk  . Smoking Tobacco Use: Former Smoker  . Smokeless Tobacco Use: Never Used   Social History   Substance and Sexual Activity  Alcohol Use Yes   Comment: occ. glass of wine    No family history on file.  Review of Systems  Constitutional: Negative for chills and fever.  HENT: Negative for congestion, sore throat and tinnitus.   Eyes: Negative for double vision, photophobia and pain.  Respiratory: Negative for cough, shortness of breath and wheezing.   Cardiovascular: Negative for chest pain, palpitations and orthopnea.  Gastrointestinal: Negative for heartburn, nausea and vomiting.  Genitourinary: Negative for dysuria, frequency and urgency.  Musculoskeletal: Positive for joint pain.  Neurological: Negative for dizziness, weakness and headaches.     Objective:  Physical Exam: Well nourished and well developed.  General: Alert and oriented x3, cooperative and pleasant, no acute distress.   Head: normocephalic, atraumatic, neck supple.  Eyes: EOMI.  Respiratory: breath sounds clear in all fields, no wheezing, rales, or rhonchi. Cardiovascular: Regular rate and rhythm, no murmurs, gallops or rubs.  Abdomen: non-tender to palpation and soft, normoactive bowel sounds. Musculoskeletal:  Left Hip Exam:  ROM: Flexion to 100, Internal Rotation is minimal, External Rotation 20, and abduction 20. It appears that she has less motion now than she even had at her last visit.  There is no tenderness over the greater trochanter.  There is pain on provocative testing of the hip.   Calves soft and nontender. Motor function intact in LE. Strength 5/5 LE bilaterally. Neuro: Distal pulses 2+. Sensation to light touch intact in LE.  Imaging Review Plain radiographs demonstrate severe degenerative joint disease of the left hip. The bone quality appears to be adequate for age and reported activity level.  Assessment/Plan:  End  stage arthritis, left hip  The patient history, physical examination, clinical judgement of the provider and imaging studies are consistent with end stage degenerative joint disease of the left hip and total hip arthroplasty is deemed medically necessary. The treatment options including medical management, injection therapy, arthroscopy and arthroplasty were discussed at length. The risks and benefits of total hip arthroplasty were presented and reviewed. The risks due to aseptic loosening, infection, stiffness, dislocation/subluxation, thromboembolic complications and other imponderables were discussed. The patient acknowledged the explanation, agreed to proceed with the plan and consent was signed. Patient is being admitted for inpatient treatment for surgery, pain control, PT, OT, prophylactic antibiotics, VTE prophylaxis, progressive ambulation and ADLs and discharge planning.The patient is planning to be discharged home.   Patient's anticipated LOS is less than 2  midnights, meeting these requirements: - Younger than 64 - Lives within 1 hour of care - Has a competent adult at home to recover with post-op recover - NO history of  - Chronic pain requiring opiods  - Diabetes  - Coronary Artery Disease  - Heart failure  - Heart attack  - Stroke  - DVT/VTE  - Cardiac arrhythmia  - Respiratory Failure/COPD  - Renal failure  - Anemia  - Advanced Liver disease  Therapy Plans: HEP Disposition: Home with husband Planned DVT Prophylaxis: Aspirin 325 mg BID DME Needed: None PCP: Monico Blitz, MD (clearance received) TXA: IV Allergies: PCN (hives) Anesthesia Concerns: Nausea, difficulty awakening with previous THA BMI: 30.8 Last HgbA1c: Not diabetic Pharmacy: [facility]  Other:  Had issues with dizziness when working with therapy last THA  - Patient was instructed on what medications to stop prior to surgery. - Follow-up visit in 2 weeks with Dr. Wynelle Link - Begin physical therapy following surgery - Pre-operative lab work as pre-surgical testing - Prescriptions will be provided in hospital at time of discharge  Theresa Duty, PA-C Orthopedic Surgery [facility] [place]

## 2020-10-17 ENCOUNTER — Other Ambulatory Visit (HOSPITAL_COMMUNITY)
Admission: RE | Admit: 2020-10-17 | Discharge: 2020-10-17 | Disposition: A | Discharge status: Home / Self Care | Payer: Medicare Other | Source: Ambulatory Visit | Attending: Orthopedic Surgery | Admitting: Orthopedic Surgery

## 2020-10-17 DIAGNOSIS — Z01812 Encounter for preprocedural laboratory examination: Secondary | ICD-10-CM | POA: Insufficient documentation

## 2020-10-17 DIAGNOSIS — Z20822 Contact with and (suspected) exposure to covid-19: Secondary | ICD-10-CM | POA: Insufficient documentation

## 2020-10-17 LAB — SARS CORONAVIRUS 2 (TAT 6-24 HRS): SARS Coronavirus 2: NEGATIVE

## 2020-10-21 ENCOUNTER — Encounter (HOSPITAL_COMMUNITY)
Admission: RE | Disposition: A | Discharge status: Home / Self Care | Payer: Self-pay | Source: Home / Self Care | Attending: Orthopedic Surgery

## 2020-10-21 ENCOUNTER — Ambulatory Visit (HOSPITAL_COMMUNITY): Payer: Medicare Other | Admitting: Anesthesiology

## 2020-10-21 ENCOUNTER — Other Ambulatory Visit: Payer: Self-pay

## 2020-10-21 ENCOUNTER — Ambulatory Visit (HOSPITAL_COMMUNITY): Payer: Medicare Other

## 2020-10-21 ENCOUNTER — Observation Stay (HOSPITAL_COMMUNITY): Payer: Medicare Other

## 2020-10-21 ENCOUNTER — Observation Stay (HOSPITAL_COMMUNITY)
Admission: RE | Admit: 2020-10-21 | Discharge: 2020-10-22 | Disposition: A | Discharge status: Home / Self Care | Payer: Medicare Other | Attending: Orthopedic Surgery | Admitting: Orthopedic Surgery

## 2020-10-21 ENCOUNTER — Encounter (HOSPITAL_COMMUNITY): Payer: Self-pay | Admitting: Orthopedic Surgery

## 2020-10-21 DIAGNOSIS — Z87891 Personal history of nicotine dependence: Secondary | ICD-10-CM | POA: Insufficient documentation

## 2020-10-21 DIAGNOSIS — Z471 Aftercare following joint replacement surgery: Secondary | ICD-10-CM | POA: Diagnosis not present

## 2020-10-21 DIAGNOSIS — Z96642 Presence of left artificial hip joint: Secondary | ICD-10-CM | POA: Diagnosis not present

## 2020-10-21 DIAGNOSIS — Z79899 Other long term (current) drug therapy: Secondary | ICD-10-CM | POA: Insufficient documentation

## 2020-10-21 DIAGNOSIS — M1612 Unilateral primary osteoarthritis, left hip: Secondary | ICD-10-CM | POA: Diagnosis not present

## 2020-10-21 DIAGNOSIS — I10 Essential (primary) hypertension: Secondary | ICD-10-CM | POA: Insufficient documentation

## 2020-10-21 DIAGNOSIS — M25552 Pain in left hip: Secondary | ICD-10-CM | POA: Diagnosis present

## 2020-10-21 DIAGNOSIS — Z419 Encounter for procedure for purposes other than remedying health state, unspecified: Secondary | ICD-10-CM

## 2020-10-21 DIAGNOSIS — Z87442 Personal history of urinary calculi: Secondary | ICD-10-CM | POA: Diagnosis not present

## 2020-10-21 DIAGNOSIS — Z96641 Presence of right artificial hip joint: Secondary | ICD-10-CM | POA: Diagnosis not present

## 2020-10-21 DIAGNOSIS — Z7982 Long term (current) use of aspirin: Secondary | ICD-10-CM | POA: Insufficient documentation

## 2020-10-21 DIAGNOSIS — M170 Bilateral primary osteoarthritis of knee: Secondary | ICD-10-CM | POA: Diagnosis not present

## 2020-10-21 DIAGNOSIS — Z96649 Presence of unspecified artificial hip joint: Secondary | ICD-10-CM

## 2020-10-21 HISTORY — PX: TOTAL HIP ARTHROPLASTY: SHX124

## 2020-10-21 LAB — TYPE AND SCREEN
ABO/RH(D): AB POS
Antibody Screen: NEGATIVE

## 2020-10-21 SURGERY — ARTHROPLASTY, HIP, TOTAL, ANTERIOR APPROACH
Anesthesia: Spinal | Site: Hip | Laterality: Left

## 2020-10-21 MED ORDER — DEXAMETHASONE SODIUM PHOSPHATE 10 MG/ML IJ SOLN
INTRAMUSCULAR | Status: DC | PRN
  Administered 2020-10-21: 10 mg via INTRAVENOUS

## 2020-10-21 MED ORDER — EPHEDRINE SULFATE-NACL 50-0.9 MG/10ML-% IV SOSY
PREFILLED_SYRINGE | INTRAVENOUS | Status: DC | PRN
  Administered 2020-10-21 (×3): 10 mg via INTRAVENOUS

## 2020-10-21 MED ORDER — 0.9 % SODIUM CHLORIDE (POUR BTL) OPTIME
TOPICAL | Status: DC | PRN
  Administered 2020-10-21: 1000 mL

## 2020-10-21 MED ORDER — MIDAZOLAM HCL 2 MG/2ML IJ SOLN
INTRAMUSCULAR | Status: AC
  Filled 2020-10-21: qty 2

## 2020-10-21 MED ORDER — LACTATED RINGERS IV SOLN: INTRAVENOUS | Status: DC

## 2020-10-21 MED ORDER — METOCLOPRAMIDE HCL 5 MG PO TABS
5.0000 mg | ORAL_TABLET | Freq: Three times a day (TID) | ORAL | Status: DC | PRN
Start: 2020-10-21 — End: 2020-10-22

## 2020-10-21 MED ORDER — TRANEXAMIC ACID-NACL 1000-0.7 MG/100ML-% IV SOLN
1000.0000 mg | INTRAVENOUS | Status: AC
  Administered 2020-10-21: 1000 mg via INTRAVENOUS
  Filled 2020-10-21: qty 100

## 2020-10-21 MED ORDER — METHOCARBAMOL 500 MG IVPB - SIMPLE MED
500.0000 mg | Freq: Four times a day (QID) | INTRAVENOUS | Status: DC | PRN
  Filled 2020-10-21: qty 50

## 2020-10-21 MED ORDER — DEXAMETHASONE SODIUM PHOSPHATE 10 MG/ML IJ SOLN: 8.0000 mg | Freq: Once | INTRAMUSCULAR | Status: DC

## 2020-10-21 MED ORDER — BISACODYL 10 MG RE SUPP: 10.0000 mg | Freq: Every day | RECTAL | Status: DC | PRN

## 2020-10-21 MED ORDER — MENTHOL 3 MG MT LOZG: 1.0000 | LOZENGE | OROMUCOSAL | Status: DC | PRN

## 2020-10-21 MED ORDER — DEXAMETHASONE SODIUM PHOSPHATE 10 MG/ML IJ SOLN
INTRAMUSCULAR | Status: AC
  Filled 2020-10-21: qty 1

## 2020-10-21 MED ORDER — BUPIVACAINE IN DEXTROSE 0.75-8.25 % IT SOLN
INTRATHECAL | Status: DC | PRN
  Administered 2020-10-21: 1.6 mL via INTRATHECAL

## 2020-10-21 MED ORDER — MORPHINE SULFATE (PF) 2 MG/ML IV SOLN
0.5000 mg | INTRAVENOUS | Status: DC | PRN
  Administered 2020-10-21 – 2020-10-22 (×2): 1 mg via INTRAVENOUS
  Filled 2020-10-21 (×2): qty 1

## 2020-10-21 MED ORDER — SODIUM CHLORIDE 0.9 % IV SOLN: INTRAVENOUS | Status: DC

## 2020-10-21 MED ORDER — METOCLOPRAMIDE HCL 5 MG/ML IJ SOLN
5.0000 mg | Freq: Three times a day (TID) | INTRAMUSCULAR | Status: DC | PRN
Start: 2020-10-21 — End: 2020-10-22

## 2020-10-21 MED ORDER — BUPIVACAINE-EPINEPHRINE 0.25% -1:200000 IJ SOLN
INTRAMUSCULAR | Status: AC
  Filled 2020-10-21: qty 1

## 2020-10-21 MED ORDER — MIDAZOLAM HCL 5 MG/5ML IJ SOLN
INTRAMUSCULAR | Status: DC | PRN
  Administered 2020-10-21: 2 mg via INTRAVENOUS

## 2020-10-21 MED ORDER — FENTANYL CITRATE (PF) 100 MCG/2ML IJ SOLN: 25.0000 ug | INTRAMUSCULAR | Status: DC | PRN

## 2020-10-21 MED ORDER — EPHEDRINE 5 MG/ML INJ
INTRAVENOUS | Status: AC
  Filled 2020-10-21: qty 10

## 2020-10-21 MED ORDER — ASPIRIN EC 325 MG PO TBEC
325.0000 mg | DELAYED_RELEASE_TABLET | Freq: Two times a day (BID) | ORAL | Status: DC
  Administered 2020-10-22: 325 mg via ORAL
  Filled 2020-10-21: qty 1

## 2020-10-21 MED ORDER — ACETAMINOPHEN 10 MG/ML IV SOLN
1000.0000 mg | Freq: Once | INTRAVENOUS | Status: AC
  Administered 2020-10-21: 1000 mg via INTRAVENOUS
  Filled 2020-10-21: qty 100

## 2020-10-21 MED ORDER — PROPOFOL 500 MG/50ML IV EMUL
INTRAVENOUS | Status: DC | PRN
  Administered 2020-10-21: 75 ug/kg/min via INTRAVENOUS

## 2020-10-21 MED ORDER — TRAMADOL HCL 50 MG PO TABS
50.0000 mg | ORAL_TABLET | Freq: Four times a day (QID) | ORAL | Status: DC | PRN
  Administered 2020-10-21 – 2020-10-22 (×2): 100 mg via ORAL
  Filled 2020-10-21 (×2): qty 2

## 2020-10-21 MED ORDER — POVIDONE-IODINE 10 % EX SWAB
2.0000 "application " | Freq: Once | CUTANEOUS | Status: AC
  Administered 2020-10-21: 2 via TOPICAL

## 2020-10-21 MED ORDER — FENTANYL CITRATE (PF) 100 MCG/2ML IJ SOLN
INTRAMUSCULAR | Status: DC | PRN
  Administered 2020-10-21: 100 ug via INTRAVENOUS

## 2020-10-21 MED ORDER — ONDANSETRON HCL 4 MG/2ML IJ SOLN: 4.0000 mg | Freq: Four times a day (QID) | INTRAMUSCULAR | Status: DC | PRN

## 2020-10-21 MED ORDER — MAGNESIUM CITRATE PO SOLN: 1.0000 | Freq: Once | ORAL | Status: DC | PRN

## 2020-10-21 MED ORDER — HYDROCODONE-ACETAMINOPHEN 5-325 MG PO TABS
1.0000 | ORAL_TABLET | ORAL | Status: DC | PRN
  Administered 2020-10-21 – 2020-10-22 (×4): 2 via ORAL
  Filled 2020-10-21 (×5): qty 2

## 2020-10-21 MED ORDER — PROPOFOL 10 MG/ML IV BOLUS
INTRAVENOUS | Status: DC | PRN
  Administered 2020-10-21 (×4): 20 mg via INTRAVENOUS

## 2020-10-21 MED ORDER — CEFAZOLIN SODIUM-DEXTROSE 2-4 GM/100ML-% IV SOLN
2.0000 g | INTRAVENOUS | Status: AC
  Administered 2020-10-21: 2 g via INTRAVENOUS
  Filled 2020-10-21: qty 100

## 2020-10-21 MED ORDER — BUPIVACAINE-EPINEPHRINE (PF) 0.25% -1:200000 IJ SOLN
INTRAMUSCULAR | Status: DC | PRN
  Administered 2020-10-21: 30 mL via PERINEURAL

## 2020-10-21 MED ORDER — FENTANYL CITRATE (PF) 100 MCG/2ML IJ SOLN
INTRAMUSCULAR | Status: AC
  Filled 2020-10-21: qty 2

## 2020-10-21 MED ORDER — ONDANSETRON HCL 4 MG/2ML IJ SOLN
INTRAMUSCULAR | Status: AC
  Filled 2020-10-21: qty 2

## 2020-10-21 MED ORDER — METHOCARBAMOL 500 MG PO TABS
500.0000 mg | ORAL_TABLET | Freq: Four times a day (QID) | ORAL | Status: DC | PRN
  Administered 2020-10-21 – 2020-10-22 (×3): 500 mg via ORAL
  Filled 2020-10-21 (×3): qty 1

## 2020-10-21 MED ORDER — LIDOCAINE 2% (20 MG/ML) 5 ML SYRINGE
INTRAMUSCULAR | Status: AC
  Filled 2020-10-21: qty 5

## 2020-10-21 MED ORDER — PHENOL 1.4 % MT LIQD: 1.0000 | OROMUCOSAL | Status: DC | PRN

## 2020-10-21 MED ORDER — DEXAMETHASONE SODIUM PHOSPHATE 10 MG/ML IJ SOLN
10.0000 mg | Freq: Once | INTRAMUSCULAR | Status: AC
  Administered 2020-10-22: 10 mg via INTRAVENOUS
  Filled 2020-10-21: qty 1

## 2020-10-21 MED ORDER — ONDANSETRON HCL 4 MG PO TABS: 4.0000 mg | ORAL_TABLET | Freq: Four times a day (QID) | ORAL | Status: DC | PRN

## 2020-10-21 MED ORDER — PROPOFOL 10 MG/ML IV BOLUS
INTRAVENOUS | Status: AC
  Filled 2020-10-21: qty 20

## 2020-10-21 MED ORDER — CHLORHEXIDINE GLUCONATE 0.12 % MT SOLN
15.0000 mL | Freq: Once | OROMUCOSAL | Status: AC
  Administered 2020-10-21: 15 mL via OROMUCOSAL

## 2020-10-21 MED ORDER — DOCUSATE SODIUM 100 MG PO CAPS
100.0000 mg | ORAL_CAPSULE | Freq: Two times a day (BID) | ORAL | Status: DC
  Administered 2020-10-21 – 2020-10-22 (×2): 100 mg via ORAL
  Filled 2020-10-21 (×2): qty 1

## 2020-10-21 MED ORDER — ACETAMINOPHEN 325 MG PO TABS
325.0000 mg | ORAL_TABLET | Freq: Four times a day (QID) | ORAL | Status: DC | PRN
Start: 2020-10-22 — End: 2020-10-22

## 2020-10-21 MED ORDER — ONDANSETRON HCL 4 MG/2ML IJ SOLN
INTRAMUSCULAR | Status: DC | PRN
  Administered 2020-10-21: 4 mg via INTRAVENOUS

## 2020-10-21 MED ORDER — CEFAZOLIN SODIUM-DEXTROSE 2-4 GM/100ML-% IV SOLN
2.0000 g | Freq: Four times a day (QID) | INTRAVENOUS | Status: AC
  Administered 2020-10-21 – 2020-10-22 (×2): 2 g via INTRAVENOUS
  Filled 2020-10-21 (×2): qty 100

## 2020-10-21 MED ORDER — POLYETHYLENE GLYCOL 3350 17 G PO PACK: 17.0000 g | PACK | Freq: Every day | ORAL | Status: DC | PRN

## 2020-10-21 MED ORDER — ORAL CARE MOUTH RINSE: 15.0000 mL | Freq: Once | OROMUCOSAL | Status: AC

## 2020-10-21 MED ORDER — PROPOFOL 500 MG/50ML IV EMUL
INTRAVENOUS | Status: AC
  Filled 2020-10-21: qty 50

## 2020-10-21 SURGICAL SUPPLY — 46 items
BAG DECANTER FOR FLEXI CONT (MISCELLANEOUS) IMPLANT
BAG SPEC THK2 15X12 ZIP CLS (MISCELLANEOUS)
BAG ZIPLOCK 12X15 (MISCELLANEOUS) IMPLANT
BLADE SAG 18X100X1.27 (BLADE) ×2 IMPLANT
COVER PERINEAL POST (MISCELLANEOUS) ×2 IMPLANT
COVER SURGICAL LIGHT HANDLE (MISCELLANEOUS) ×2 IMPLANT
COVER WAND RF STERILE (DRAPES) IMPLANT
CUP ACETBLR 48 OD SECTOR II (Hips) ×2 IMPLANT
DECANTER SPIKE VIAL GLASS SM (MISCELLANEOUS) ×2 IMPLANT
DRAPE STERI IOBAN 125X83 (DRAPES) ×2 IMPLANT
DRAPE U-SHAPE 47X51 STRL (DRAPES) ×4 IMPLANT
DRESSING AQUACEL AG SP 3.5X10 (GAUZE/BANDAGES/DRESSINGS) ×1 IMPLANT
DRSG AQUACEL AG ADV 3.5X10 (GAUZE/BANDAGES/DRESSINGS) ×2 IMPLANT
DRSG AQUACEL AG SP 3.5X10 (GAUZE/BANDAGES/DRESSINGS) ×2
DURAPREP 26ML APPLICATOR (WOUND CARE) ×2 IMPLANT
ELECT REM PT RETURN 15FT ADLT (MISCELLANEOUS) ×2 IMPLANT
EVACUATOR 1/8 PVC DRAIN (DRAIN) IMPLANT
GLOVE SRG 8 PF TXTR STRL LF DI (GLOVE) ×1 IMPLANT
GLOVE SURG ENC MOIS LTX SZ6 (GLOVE) IMPLANT
GLOVE SURG ENC MOIS LTX SZ6.5 (GLOVE) ×2 IMPLANT
GLOVE SURG ENC MOIS LTX SZ8 (GLOVE) ×4 IMPLANT
GLOVE SURG ENC TEXT LTX SZ7 (GLOVE) IMPLANT
GLOVE SURG UNDER POLY LF SZ6.5 (GLOVE) IMPLANT
GLOVE SURG UNDER POLY LF SZ8 (GLOVE) ×2
GLOVE SURG UNDER POLY LF SZ8.5 (GLOVE) IMPLANT
GOWN STRL REUS W/TWL LRG LVL3 (GOWN DISPOSABLE) ×2 IMPLANT
GOWN STRL REUS W/TWL XL LVL3 (GOWN DISPOSABLE) IMPLANT
HEAD CERAMIC DELTA 28 P1.5 HIP (Head) ×2 IMPLANT
HOLDER FOLEY CATH W/STRAP (MISCELLANEOUS) ×2 IMPLANT
KIT TURNOVER KIT A (KITS) ×2 IMPLANT
LINER MARATHON 28 48 (Hips) ×2 IMPLANT
MANIFOLD NEPTUNE II (INSTRUMENTS) ×2 IMPLANT
PACK ANTERIOR HIP CUSTOM (KITS) ×2 IMPLANT
PENCIL SMOKE EVACUATOR COATED (MISCELLANEOUS) ×2 IMPLANT
STEM FEM SZ3 STD ACTIS (Stem) ×2 IMPLANT
STRIP CLOSURE SKIN 1/2X4 (GAUZE/BANDAGES/DRESSINGS) ×2 IMPLANT
SUT ETHIBOND NAB CT1 #1 30IN (SUTURE) ×2 IMPLANT
SUT MNCRL AB 4-0 PS2 18 (SUTURE) ×2 IMPLANT
SUT STRATAFIX 0 PDS 27 VIOLET (SUTURE) ×2
SUT VIC AB 2-0 CT1 27 (SUTURE) ×4
SUT VIC AB 2-0 CT1 TAPERPNT 27 (SUTURE) ×2 IMPLANT
SUTURE STRATFX 0 PDS 27 VIOLET (SUTURE) ×1 IMPLANT
SYR 50ML LL SCALE MARK (SYRINGE) IMPLANT
TAPE STRIPS DRAPE STRL (GAUZE/BANDAGES/DRESSINGS) ×2 IMPLANT
TRAY FOLEY MTR SLVR 16FR STAT (SET/KITS/TRAYS/PACK) ×2 IMPLANT
TUBE SUCTION HIGH CAP CLEAR NV (SUCTIONS) ×2 IMPLANT

## 2020-10-21 NOTE — Interval H&P Note (Signed)
History and Physical Interval Note:  10/21/2020 1:22 PM  Denise Steele  has presented today for surgery, with the diagnosis of Left hip osteoarthritis.  The various methods of treatment have been discussed with the patient and family. After consideration of risks, benefits and other options for treatment, the patient has consented to  Procedure(s) with comments: [place] (Left) - 118min as a surgical intervention.  The patient's history has been reviewed, patient examined, no change in status, stable for surgery.  I have reviewed the patient's chart and labs.  Questions were answered to the patient's satisfaction.     Pilar Plate Mechille Varghese

## 2020-10-21 NOTE — Anesthesia Procedure Notes (Signed)
Spinal  Patient location during procedure: OR Start time: 10/21/2020 2:17 PM End time: 10/21/2020 2:22 PM Reason for block: surgical anesthesia Staffing Performed: anesthesiologist  Anesthesiologist: Roderic Palau, MD Preanesthetic Checklist Completed: patient identified, IV checked, risks and benefits discussed, surgical consent, monitors and equipment checked, pre-op evaluation and timeout performed Spinal Block Patient position: sitting Prep: DuraPrep Patient monitoring: cardiac monitor, continuous pulse ox and blood pressure Approach: midline Location: L3-4 Injection technique: single-shot Needle Needle type: Pencan  Needle gauge: 24 G Needle length: 9 cm Assessment Sensory level: T8 Events: CSF return Additional Notes Functioning IV was confirmed and monitors were applied. Sterile prep and drape, including hand hygiene and sterile gloves were used. The patient was positioned and the spine was prepped. The skin was anesthetized with lidocaine.  Free flow of clear CSF was obtained prior to injecting local anesthetic into the CSF.  The spinal needle aspirated freely following injection.  The needle was carefully withdrawn.  The patient tolerated the procedure well.

## 2020-10-21 NOTE — Anesthesia Postprocedure Evaluation (Signed)
Anesthesia Post Note  Patient: Denise Steele  Procedure(s) Performed: TOTAL HIP ARTHROPLASTY ANTERIOR APPROACH (Left Hip)     Patient location during evaluation: PACU Anesthesia Type: Spinal Level of consciousness: oriented and awake and alert Pain management: pain level controlled Vital Signs Assessment: post-procedure vital signs reviewed and stable Respiratory status: spontaneous breathing and respiratory function stable Cardiovascular status: blood pressure returned to baseline and stable Postop Assessment: no headache, no backache, no apparent nausea or vomiting, spinal receding and patient able to bend at knees Anesthetic complications: no   No complications documented.  Last Vitals:  Vitals:   10/21/20 1615 10/21/20 1649  BP: (!) 115/58 113/66  Pulse: 72 62  Resp: 12 14  Temp:    SpO2: 95% 100%    Last Pain:  Vitals:   10/21/20 1649  TempSrc:   PainSc: 3                  Daylah Sayavong,W. EDMOND

## 2020-10-21 NOTE — Anesthesia Procedure Notes (Signed)
Date/Time: 10/21/2020 2:17 PM Performed by: Sharlette Dense, CRNA Oxygen Delivery Method: Simple face mask

## 2020-10-21 NOTE — Anesthesia Preprocedure Evaluation (Addendum)
Anesthesia Evaluation  Patient identified by MRN, date of birth, ID band Patient awake    Reviewed: Allergy & Precautions, H&P , NPO status , Patient's Chart, lab work & pertinent test results  History of Anesthesia Complications (+) PONV  Airway Mallampati: II  TM Distance: >3 FB Neck ROM: Full    Dental no notable dental hx. (+) Teeth Intact, Dental Advisory Given   Pulmonary neg pulmonary ROS, former smoker,    Pulmonary exam normal breath sounds clear to auscultation       Cardiovascular hypertension, Pt. on medications  Rhythm:Regular Rate:Normal     Neuro/Psych negative neurological ROS  negative psych ROS   GI/Hepatic negative GI ROS, Neg liver ROS,   Endo/Other  negative endocrine ROS  Renal/GU negative Renal ROS  negative genitourinary   Musculoskeletal  (+) Arthritis , Osteoarthritis,    Abdominal   Peds  Hematology negative hematology ROS (+)   Anesthesia Other Findings   Reproductive/Obstetrics negative OB ROS                            Anesthesia Physical Anesthesia Plan  ASA: II  Anesthesia Plan: Spinal   Post-op Pain Management:    Induction: Intravenous  PONV Risk Score and Plan: 4 or greater and Propofol infusion, Ondansetron and Midazolam  Airway Management Planned: Simple Face Mask  Additional Equipment:   Intra-op Plan:   Post-operative Plan:   Informed Consent: I have reviewed the patients History and Physical, chart, labs and discussed the procedure including the risks, benefits and alternatives for the proposed anesthesia with the patient or authorized representative who has indicated his/her understanding and acceptance.     Dental advisory given  Plan Discussed with: CRNA  Anesthesia Plan Comments:         Anesthesia Quick Evaluation

## 2020-10-21 NOTE — Transfer of Care (Signed)
Immediate Anesthesia Transfer of Care Note  Patient: Denise Steele  Procedure(s) Performed: TOTAL HIP ARTHROPLASTY ANTERIOR APPROACH (Left Hip)  Patient Location: PACU  Anesthesia Type:Spinal  Level of Consciousness: awake  Airway & Oxygen Therapy: Patient Spontanous Breathing and Patient connected to face mask oxygen  Post-op Assessment: Report given to RN and Post -op Vital signs reviewed and stable  Post vital signs: Reviewed and stable  Last Vitals:  Vitals Value Taken Time  BP 121/54 10/21/20 1551  Temp    Pulse 82 10/21/20 1553  Resp 20 10/21/20 1553  SpO2 100 % 10/21/20 1553  Vitals shown include unvalidated device data.  Last Pain:  Vitals:   10/21/20 1222  TempSrc:   PainSc: 0-No pain         Complications: No complications documented.

## 2020-10-21 NOTE — Op Note (Signed)
OPERATIVE REPORT- TOTAL HIP ARTHROPLASTY   PREOPERATIVE DIAGNOSIS: Osteoarthritis of the Left hip.   POSTOPERATIVE DIAGNOSIS: Osteoarthritis of the Left  hip.   PROCEDURE: Left total hip arthroplasty, anterior approach.   SURGEON: Gaynelle Arabian, MD   ASSISTANT: Theresa Duty, PA-C  ANESTHESIA:  Spinal  ESTIMATED BLOOD LOSS:-300 mL    DRAINS: Hemovac x1.   COMPLICATIONS: None   CONDITION: PACU - hemodynamically stable.   BRIEF CLINICAL NOTE: Denise Steele is a 65 y.o. female who has rapidly progressive  arthritis of their Left  hip with progressively worsening pain and  dysfunction.The patient has failed nonoperative management and presents for  total hip arthroplasty.   PROCEDURE IN DETAIL: After successful administration of spinal  anesthetic, the traction boots for the [facility] bed were placed on both  feet and the patient was placed onto the [facility] bed, boots placed into the leg  holders. The Left hip was then isolated from the perineum with plastic  drapes and prepped and draped in the usual sterile fashion. ASIS and  greater trochanter were marked and a oblique incision was made, starting  at about 1 cm lateral and 2 cm distal to the ASIS and coursing towards  the anterior cortex of the femur. The skin was cut with a 10 blade  through subcutaneous tissue to the level of the fascia overlying the  tensor fascia lata muscle. The fascia was then incised in line with the  incision at the junction of the anterior third and posterior 2/3rd. The  muscle was teased off the fascia and then the interval between the TFL  and the rectus was developed. The Hohmann retractor was then placed at  the top of the femoral neck over the capsule. The vessels overlying the  capsule were cauterized and the fat on top of the capsule was removed.  A Hohmann retractor was then placed anterior underneath the rectus  femoris to give exposure to the entire anterior capsule. A T-shaped   capsulotomy was performed. The edges were tagged and the femoral head  was identified.       Osteophytes are removed off the superior acetabulum.  The femoral neck was then cut in situ with an oscillating saw. Traction  was then applied to the left lower extremity utilizing the [facility]  traction. The femoral head was then removed. Retractors were placed  around the acetabulum and then circumferential removal of the labrum was  performed. Osteophytes were also removed. Reaming starts at 45 mm to  medialize and  Increased in 2 mm increments to 47 mm. We reamed in  approximately 40 degrees of abduction, 20 degrees anteversion. A 48 mm  pinnacle acetabular shell was then impacted in anatomic position under  fluoroscopic guidance with excellent purchase. We did not need to place  any additional dome screws. A 28 mm neutral + 4 marathon liner was then  placed into the acetabular shell.       The femoral lift was then placed along the lateral aspect of the femur  just distal to the vastus ridge. The leg was  externally rotated and capsule  was stripped off the inferior aspect of the femoral neck down to the  level of the lesser trochanter, this was done with electrocautery. The femur was lifted after this was performed. The  leg was then placed in an extended and adducted position essentially delivering the femur. We also removed the capsule superiorly and the piriformis from the piriformis fossa to  gain excellent exposure of the  proximal femur. Rongeur was used to remove some cancellous bone to get  into the lateral portion of the proximal femur for placement of the  initial starter reamer. The starter broaches was placed  the starter broach  and was shown to go down the center of the canal. Broaching  with the Actis system was then performed starting at size 0  coursing  Up to size 3. A size 3 had excellent torsional and rotational  and axial stability. The trial standard offset neck was then  placed  with a 28 + 1.5 trial head. The hip was then reduced. We confirmed that  the stem was in the canal both on AP and lateral x-rays. It also has excellent sizing. The hip was reduced with outstanding stability through full extension and full external rotation.. AP pelvis was taken and the leg lengths were measured and found to be equal. Hip was then dislocated again and the femoral head and neck removed. The  femoral broach was removed. Size 3 Actis stem with a standard offset  neck was then impacted into the femur following native anteversion. Has  excellent purchase in the canal. Excellent torsional and rotational and  axial stability. It is confirmed to be in the canal on AP and lateral  fluoroscopic views. The 28 + 1.5 ceramic head was placed and the hip  reduced with outstanding stability. Again AP pelvis was taken and it  confirmed that the leg lengths were equal. The wound was then copiously  irrigated with saline solution and the capsule reattached and repaired  with Ethibond suture. 30 ml of .25% Bupivicaine was  injected into the capsule and into the edge of the tensor fascia lata as well as subcutaneous tissue. The fascia overlying the tensor fascia lata was then closed with a running #1 V-Loc. Subcu was closed with interrupted 2-0 Vicryl and subcuticular running 4-0 Monocryl. Incision was cleaned  and dried. Steri-Strips and a bulky sterile dressing applied. Hemovac  drain was hooked to suction and then the patient was awakened and transported to  recovery in stable condition.        Please note that a surgical assistant was a medical necessity for this procedure to perform it in a safe and expeditious manner. Assistant was necessary to provide appropriate retraction of vital neurovascular structures and to prevent femoral fracture and allow for anatomic placement of the prosthesis.  Gaynelle Arabian, M.D.

## 2020-10-22 ENCOUNTER — Encounter (HOSPITAL_COMMUNITY): Payer: Self-pay | Admitting: Orthopedic Surgery

## 2020-10-22 DIAGNOSIS — Z96641 Presence of right artificial hip joint: Secondary | ICD-10-CM | POA: Diagnosis not present

## 2020-10-22 DIAGNOSIS — Z7982 Long term (current) use of aspirin: Secondary | ICD-10-CM | POA: Diagnosis not present

## 2020-10-22 DIAGNOSIS — M1612 Unilateral primary osteoarthritis, left hip: Secondary | ICD-10-CM | POA: Diagnosis not present

## 2020-10-22 DIAGNOSIS — Z79899 Other long term (current) drug therapy: Secondary | ICD-10-CM | POA: Diagnosis not present

## 2020-10-22 DIAGNOSIS — Z87891 Personal history of nicotine dependence: Secondary | ICD-10-CM | POA: Diagnosis not present

## 2020-10-22 DIAGNOSIS — I10 Essential (primary) hypertension: Secondary | ICD-10-CM | POA: Diagnosis not present

## 2020-10-22 LAB — CBC
HCT: 39.4 % (ref 36.0–46.0)
Hemoglobin: 12.7 g/dL (ref 12.0–15.0)
MCH: 30.5 pg (ref 26.0–34.0)
MCHC: 32.2 g/dL (ref 30.0–36.0)
MCV: 94.7 fL (ref 80.0–100.0)
Platelets: 218 10*3/uL (ref 150–400)
RBC: 4.16 MIL/uL (ref 3.87–5.11)
RDW: 12.4 % (ref 11.5–15.5)
WBC: 9.3 10*3/uL (ref 4.0–10.5)
nRBC: 0 % (ref 0.0–0.2)

## 2020-10-22 LAB — BASIC METABOLIC PANEL
Anion gap: 8 (ref 5–15)
BUN: 17 mg/dL (ref 8–23)
CO2: 23 mmol/L (ref 22–32)
Calcium: 8.6 mg/dL — ABNORMAL LOW (ref 8.9–10.3)
Chloride: 105 mmol/L (ref 98–111)
Creatinine, Ser: 0.72 mg/dL (ref 0.44–1.00)
GFR, Estimated: >60 mL/min (ref >60)
Glucose, Bld: 198 mg/dL — ABNORMAL HIGH (ref 70–99)
Potassium: 4.5 mmol/L (ref 3.5–5.1)
Sodium: 136 mmol/L (ref 135–145)

## 2020-10-22 MED ORDER — HYDROCODONE-ACETAMINOPHEN 5-325 MG PO TABS: 1.0000 | ORAL_TABLET | Freq: Four times a day (QID) | ORAL | 0 refills | Status: DC | PRN

## 2020-10-22 MED ORDER — ASPIRIN 325 MG PO TBEC: 325.0000 mg | DELAYED_RELEASE_TABLET | Freq: Two times a day (BID) | ORAL | 0 refills | Status: DC

## 2020-10-22 MED ORDER — TRAMADOL HCL 50 MG PO TABS: 50.0000 mg | ORAL_TABLET | Freq: Four times a day (QID) | ORAL | 0 refills | Status: DC | PRN

## 2020-10-22 MED ORDER — METHOCARBAMOL 500 MG PO TABS: 500.0000 mg | ORAL_TABLET | Freq: Four times a day (QID) | ORAL | 0 refills | Status: DC | PRN

## 2020-10-22 NOTE — Progress Notes (Signed)
Physical Therapy Treatment Patient Details Name: Denise Steele MRN: [id number] DOB: 06-Mar-1956 Today's Date: 10/22/2020    History of Present Illness Pt s/p L THR and with hx of R THR last year.    PT Comments    Pt progressing well with mobility and up to ambulate limited distance in hall and negotiate stairs.  Pt reviewed written HEP and car transfers.  Pt eager for dc home this am.   Follow Up Recommendations  Follow surgeon's recommendation for DC plan and follow-up therapies     Equipment Recommendations  None recommended by PT    Recommendations for Other Services       Precautions / Restrictions Precautions Precautions: Fall Restrictions Weight Bearing Restrictions: No Other Position/Activity Restrictions: WBAT    Mobility  Bed Mobility               General bed mobility comments: NT - Pt OOB with nursing and reports minimal difficulty    Transfers Overall transfer level: Needs assistance Equipment used: Rolling walker (2 wheeled) Transfers: Sit to/from Stand Sit to Stand: Supervision         General transfer comment: cues for LE management and use of UEs to self assist  Ambulation/Gait Ambulation/Gait assistance: Min guard;Supervision Gait Distance (Feet): 100 Feet Assistive device: Rolling walker (2 wheeled) Gait Pattern/deviations: Step-to pattern;Step-through pattern;Decreased step length - right;Decreased step length - left;Shuffle;Trunk flexed     General Gait Details: min cues for posture, position from Rw and initial sequence   Stairs Stairs: Yes Stairs assistance: Min assist Stair Management: No rails;One rail Left;Forwards;With walker;With cane Number of Stairs: 7 General stair comments: single step twice fwd with RW; 5 steps fwd with rail and cane; cues for sequence and foot/cane placement   Wheelchair Mobility    Modified Rankin (Stroke Patients Only)       Balance Overall balance assessment: Mild deficits observed, not  formally tested                                          Cognition Arousal/Alertness: Awake/alert Behavior During Therapy: WFL for tasks assessed/performed Overall Cognitive Status: Within Functional Limits for tasks assessed                                        Exercises Total Joint Exercises Ankle Circles/Pumps: AROM;Both;15 reps;Supine Quad Sets: AROM;Both;10 reps;Supine Heel Slides: AAROM;Left;20 reps;Supine Hip ABduction/ADduction: AAROM;Left;15 reps;Supine Long Arc Quad: AROM;Left;10 reps;Seated    General Comments        Pertinent Vitals/Pain Pain Assessment: 0-10 Pain Score: 4  Pain Location: L hip Pain Descriptors / Indicators: Aching;Sore Pain Intervention(s): Limited activity within patient's tolerance;Monitored during session;Premedicated before session;Ice applied    Home Living Family/patient expects to be discharged to:: Private residence Living Arrangements: Spouse/significant other Available Help at Discharge: Family Type of Home: House Home Access: Stairs to enter Entrance Stairs-Rails: None Home Layout: Two level;Laundry or work area in basement;Bed/bath [employer] on [place]: [place];Shower seat;Shower seat - built in;Walker - 2 wheels;Cane - single point;Bedside commode      Prior Function Level of Independence: Independent          PT Goals (current goals can now be found in the care plan section) Acute Rehab PT Goals Patient Stated Goal:  Regain IND PT Goal Formulation: With patient Time For Goal Achievement: 10/29/20 Potential to Achieve Goals: Good Progress towards PT goals: Progressing toward goals    Frequency    7X/week      PT Plan Current plan remains appropriate    Co-evaluation              AM-PAC PT "6 Clicks" Mobility   Outcome Measure  Help needed turning from your back to your side while in a flat bed without using bedrails?: A  Little Help needed moving from lying on your back to sitting on the side of a flat bed without using bedrails?: A Little Help needed moving to and from a bed to a chair (including a wheelchair)?: A Little Help needed standing up from a chair using your arms (e.g., wheelchair or bedside chair)?: A Little Help needed to walk in hospital room?: A Little Help needed climbing 3-5 steps with a railing? : A Little 6 Click Score: 18    End of Session Equipment Utilized During Treatment: Gait belt Activity Tolerance: Patient tolerated treatment well Patient left: in chair;with call bell/phone within reach;with chair alarm set Nurse Communication: Mobility status PT Visit Diagnosis: Difficulty in walking, not elsewhere classified (R26.2)     Time: 3976-7341 PT Time Calculation (min) (ACUTE ONLY): 16 min  Charges:  $Gait Training: 8-22 mins $Therapeutic Exercise: 8-22 mins                     Debe Coder PT Acute Rehabilitation Services Pager [phone] Office [phone]    BRADSHAW,HUNTER 10/22/2020, 12:06 PM

## 2020-10-22 NOTE — Evaluation (Signed)
Physical Therapy Evaluation Patient Details Name: Denise Steele MRN: [id number] DOB: 1955-11-09 Today's Date: 10/22/2020   History of Present Illness  Pt s/p L THR and with hx of R THR last year.  Clinical Impression  Pt s/p L THR and presents with decreased L LE strength/ROM and post op pain limiting functional mobility.  Pt should progress to dc home with family assist.    Follow Up Recommendations Follow surgeon's recommendation for DC plan and follow-up therapies    Equipment Recommendations  None recommended by PT    Recommendations for Other Services       Precautions / Restrictions Precautions Precautions: Fall Restrictions Weight Bearing Restrictions: No Other Position/Activity Restrictions: WBAT      Mobility  Bed Mobility               General bed mobility comments: NT - Pt OOB with nursing and reports minimal difficulty    Transfers Overall transfer level: Needs assistance Equipment used: Rolling walker (2 wheeled) Transfers: Sit to/from Stand Sit to Stand: Min guard         General transfer comment: cues for LE management and use of UEs to self assist  Ambulation/Gait Ambulation/Gait assistance: Min assist;Min guard Gait Distance (Feet): 180 Feet Assistive device: Rolling walker (2 wheeled) Gait Pattern/deviations: Step-to pattern;Step-through pattern;Decreased step length - right;Decreased step length - left;Shuffle;Trunk flexed     General Gait Details: cues for posture, position from Rw and initial sequence  Stairs            Wheelchair Mobility    Modified Rankin (Stroke Patients Only)       Balance Overall balance assessment: Mild deficits observed, not formally tested                                           Pertinent Vitals/Pain Pain Assessment: 0-10 Pain Score: 4  Pain Location: L hip Pain Descriptors / Indicators: Aching;Sore Pain Intervention(s): Limited activity within patient's  tolerance;Monitored during session;Premedicated before session;Ice applied    Home Living Family/patient expects to be discharged to:: Private residence Living Arrangements: Spouse/significant other Available Help at Discharge: Family Type of Home: House Home Access: Stairs to enter Entrance Stairs-Rails: None Entrance Stairs-Number of Steps: 1 Home Layout: Two level;Laundry or work area in basement;Bed/bath [employer] on [place]: [place];Shower seat;Shower seat - built in;Walker - 2 wheels;Cane - single point;Bedside commode      Prior Function Level of Independence: Independent               Hand Dominance   Dominant Hand: Right    Extremity/Trunk Assessment   Upper Extremity Assessment Upper Extremity Assessment: Overall WFL for tasks assessed    Lower Extremity Assessment Lower Extremity Assessment: LLE deficits/detail LLE Deficits / Details: AAROM at hip to 80 flex and 15 abd;  Strength at hip ~ 2+/5    Cervical / Trunk Assessment Cervical / Trunk Assessment: Normal  Communication   Communication: No difficulties  Cognition Arousal/Alertness: Awake/alert Behavior During Therapy: WFL for tasks assessed/performed Overall Cognitive Status: Within Functional Limits for tasks assessed                                        General Comments      Exercises Total  Joint Exercises Ankle Circles/Pumps: AROM;Both;15 reps;Supine Quad Sets: AROM;Both;10 reps;Supine Heel Slides: AAROM;Left;20 reps;Supine Hip ABduction/ADduction: AAROM;Left;15 reps;Supine Long Arc Quad: AROM;Left;10 reps;Seated   Assessment/Plan    PT Assessment Patient needs continued PT services  PT Problem List Decreased strength;Decreased range of motion;Decreased activity tolerance;Decreased mobility;Decreased knowledge of use of DME;Pain       PT Treatment Interventions DME instruction;Gait training;Stair training;Functional mobility  training;Therapeutic activities;Therapeutic exercise;Balance training;Patient/family education    PT Goals (Current goals can be found in the Care Plan section)  Acute Rehab PT Goals Patient Stated Goal: Regain IND PT Goal Formulation: With patient Time For Goal Achievement: 10/29/20 Potential to Achieve Goals: Good    Frequency 7X/week   Barriers to discharge        Co-evaluation               AM-PAC PT "6 Clicks" Mobility  Outcome Measure Help needed turning from your back to your side while in a flat bed without using bedrails?: A Little Help needed moving from lying on your back to sitting on the side of a flat bed without using bedrails?: A Little Help needed moving to and from a bed to a chair (including a wheelchair)?: A Little Help needed standing up from a chair using your arms (e.g., wheelchair or bedside chair)?: A Little Help needed to walk in hospital room?: A Little Help needed climbing 3-5 steps with a railing? : A Little 6 Click Score: 18    End of Session Equipment Utilized During Treatment: Gait belt Activity Tolerance: Patient tolerated treatment well Patient left: in chair;with call bell/phone within reach;with chair alarm set Nurse Communication: Mobility status PT Visit Diagnosis: Difficulty in walking, not elsewhere classified (R26.2)    Time: 0832-0900 PT Time Calculation (min) (ACUTE ONLY): 28 min   Charges:   PT Evaluation $PT Eval Low Complexity: 1 Low PT Treatments $Therapeutic Exercise: 8-22 mins        Debe Coder PT Acute Rehabilitation Services Pager [phone] Office [phone]   Elex Mainwaring 10/22/2020, 12:01 PM

## 2020-10-22 NOTE — TOC Transition Note (Signed)
Transition of Care [facility]) - CM/SW Discharge Note   Patient Details  Name: Denise Steele MRN: [id number] Date of Birth: Feb 03, 1956  [facility] CM/SW Contact:  Lennart Pall, LCSW Phone Number: 10/22/2020, 9:34 AM   Clinical Narrative:    Met briefly with pt and confirming she has all needed DME.  Plan for HEP at home.  No TOC needs.   Final next level of care: Home/Self Care Barriers to Discharge: No Barriers Identified   Patient Goals and CMS Choice Patient states their goals for this hospitalization and ongoing recovery are:: return home      Discharge Placement                       Discharge Plan and Services                DME Arranged: N/A DME Agency: NA                  Social Determinants of Health (SDOH) Interventions     Readmission Risk Interventions No flowsheet data found.

## 2020-10-22 NOTE — Plan of Care (Signed)
  Problem: Education: Goal: Knowledge of the prescribed therapeutic regimen will improve Outcome: Adequate for Discharge Goal: Understanding of discharge needs will improve Outcome: Adequate for Discharge Goal: Individualized Educational Video(s) Outcome: Adequate for Discharge   Problem: Activity: Goal: Ability to avoid complications of mobility impairment will improve Outcome: Adequate for Discharge Goal: Ability to tolerate increased activity will improve Outcome: Adequate for Discharge   Problem: Clinical Measurements: Goal: Postoperative complications will be avoided or minimized Outcome: Adequate for Discharge   Problem: Pain Management: Goal: Pain level will decrease with appropriate interventions Outcome: Adequate for Discharge   Problem: Skin Integrity: Goal: Will show signs of wound healing Outcome: Adequate for Discharge  Discharge education completed, written education provided, with RN in room.

## 2020-10-22 NOTE — Progress Notes (Signed)
   Subjective: 1 Day Post-Op Procedure(s) (LRB): TOTAL HIP ARTHROPLASTY ANTERIOR APPROACH (Left) Patient reports pain as mild.   Patient seen in rounds for Dr. Wynelle Link. Patient is well, and has had no acute complaints or problems. No acute overnight events. Denies SOB, chest pain, or calf pain. Foley pulled this am.  We will begin physical therapy today.   Objective: Vital signs in last 24 hours: Temp:  [97.3 F (36.3 C)-97.8 F (36.6 C)] 97.5 F (36.4 C) (03/24 0754) Pulse Rate:  [62-83] 67 (03/24 0754) Resp:  [10-21] 16 (03/24 0754) BP: (112-153)/(52-71) 124/55 (03/24 0754) SpO2:  [95 %-100 %] 97 % (03/24 0754) Weight:  [83.9 kg] 83.9 kg (03/23 1222)  Intake/Output from previous day:  Intake/Output Summary (Last 24 hours) at 10/22/2020 0814 Last data filed at 10/22/2020 0630 Gross per 24 hour  Intake 3115.15 ml  Output 2100 ml  Net 1015.15 ml     Intake/Output this shift: No intake/output data recorded.  Labs: Recent Labs    10/22/20 0249  HGB 12.7   Recent Labs    10/22/20 0249  WBC 9.3  RBC 4.16  HCT 39.4  PLT 218   Recent Labs    10/22/20 0249  NA 136  K 4.5  CL 105  CO2 23  BUN 17  CREATININE 0.72  GLUCOSE 198*  CALCIUM 8.6*   No results for input(s): LABPT, INR in the last 72 hours.  Exam: General - Patient is Alert and Oriented Extremity - Neurologically intact Neurovascular intact Intact pulses distally Dorsiflexion/Plantar flexion intact Dressing - dressing C/D/I Motor Function - intact, moving foot and toes well on exam.   Past Medical History:  Diagnosis Date  . Arthritis    hips and knees  . History of kidney stones   . Hypertension   . Medical history non-contributory   . PONV (postoperative nausea and vomiting)     Assessment/Plan: 1 Day Post-Op Procedure(s) (LRB): TOTAL HIP ARTHROPLASTY ANTERIOR APPROACH (Left) Active Problems:   Osteoarthritis of left hip  Estimated body mass index is 30.79 kg/m as calculated from  the following:   Height as of this encounter: 5\' 5"  (1.651 m).   Weight as of this encounter: 83.9 kg. Up with therapy  DVT Prophylaxis - Aspirin and TED hose Weight bearing as tolerated. Continue therapy.  Plan for two sessions of PT today, and if meeting goals, will plan for discharge this afternoon.   Patient to follow up in clinic with Dr. Wynelle Link in two weeks.   The PDMP database was reviewed today prior to any opioid medications being prescribed to this patient.  Plan is to go Home after hospital stay.  Fenton Foy, MBA, PA-C Orthopedic Surgery 10/22/2020, 8:14 AM

## 2020-10-28 NOTE — Discharge Summary (Signed)
Physician Discharge Summary   Patient ID: Denise Steele MRN: [id number] DOB/AGE: 1956/01/26 65 y.o.  Admit date: 10/21/2020 Discharge date: 10/22/2020  Primary Diagnosis: Osteoarthritis of Left Hip  Admission Diagnoses:  Past Medical History:  Diagnosis Date  . Arthritis    hips and knees  . History of kidney stones   . Hypertension   . Medical history non-contributory   . PONV (postoperative nausea and vomiting)    Discharge Diagnoses:   Active Problems:   Osteoarthritis of left hip  Estimated body mass index is 30.79 kg/m as calculated from the following:   Height as of this encounter: 5\' 5"  (1.651 m).   Weight as of this encounter: 83.9 kg.  Procedure:  Procedure(s) (LRB): TOTAL HIP ARTHROPLASTY ANTERIOR APPROACH (Left)   Consults: None  HPI: Denise Steele is a 65 y.o. female who has rapidly progressive  arthritis of their Left  hip with progressively worsening pain and  dysfunction.The patient has failed nonoperative management and presents for  total hip arthroplasty.   Laboratory Data: Admission on 10/21/2020, Discharged on 10/22/2020  Component Date Value Ref Range Status  . WBC 10/22/2020 9.3  4.0 - 10.5 K/uL Final  . RBC 10/22/2020 4.16  3.87 - 5.11 MIL/uL Final  . Hemoglobin 10/22/2020 12.7  12.0 - 15.0 g/dL Final  . HCT 10/22/2020 39.4  36.0 - 46.0 % Final  . MCV 10/22/2020 94.7  80.0 - 100.0 fL Final  . MCH 10/22/2020 30.5  26.0 - 34.0 pg Final  . MCHC 10/22/2020 32.2  30.0 - 36.0 g/dL Final  . RDW 10/22/2020 12.4  11.5 - 15.5 % Final  . Platelets 10/22/2020 218  150 - 400 K/uL Final  . nRBC 10/22/2020 0.0  0.0 - 0.2 % Final   Performed at [facility], [place] [address] [place], [place] [address]  . Sodium 10/22/2020 136  135 - 145 mmol/L Final  . Potassium 10/22/2020 4.5  3.5 - 5.1 mmol/L Final  . Chloride 10/22/2020 105  98 - 111 mmol/L Final  . CO2 10/22/2020 23  22 - 32 mmol/L Final  . Glucose, Bld 10/22/2020 198* 70 - 99 mg/dL  Final   Glucose reference range applies only to samples taken after fasting for at least 8 hours.  . BUN 10/22/2020 17  8 - 23 mg/dL Final  . Creatinine, Ser 10/22/2020 0.72  0.44 - 1.00 mg/dL Final  . Calcium 10/22/2020 8.6* 8.9 - 10.3 mg/dL Final  . GFR, Estimated 10/22/2020 >60  >60 mL/min Final   Comment: (NOTE) Calculated using the CKD-EPI Creatinine Equation (2021)   . Anion gap 10/22/2020 8  5 - 15 Final   Performed at [facility], [place] [address], [place], [place] [address]  Hospital Outpatient Visit on 10/17/2020  Component Date Value Ref Range Status  . SARS Coronavirus 2 10/17/2020 NEGATIVE  NEGATIVE Final   Comment: (NOTE) SARS-CoV-2 target nucleic acids are NOT DETECTED.  The SARS-CoV-2 RNA is generally detectable in upper and lower respiratory specimens during the acute phase of infection. Negative results do not preclude SARS-CoV-2 infection, do not rule out co-infections with other pathogens, and should not be used as the sole basis for treatment or other patient management decisions. Negative results must be combined with clinical observations, patient history, and epidemiological information. The expected result is Negative.  Fact Sheet for Patients: SugarRoll.be  Fact Sheet for Healthcare Providers: https://www.woods-mathews.com/  This test is not yet approved or cleared by the [place] FDA and  has been authorized for detection and/or diagnosis of SARS-CoV-2 by FDA under an Emergency Use Authorization (EUA). This EUA will remain  in effect (meaning this test can be used) for the duration of the COVID-19 declaration under Se                          ction 564(b)(1) of the Act, 21 U.S.C. section 360bbb-3(b)(1), unless the authorization is terminated or revoked sooner.  Performed at [facility], [place] [address] [place], [place] [address]   Hospital Outpatient Visit on 10/12/2020   Component Date Value Ref Range Status  . WBC 10/12/2020 5.0  4.0 - 10.5 K/uL Final  . RBC 10/12/2020 4.81  3.87 - 5.11 MIL/uL Final  . Hemoglobin 10/12/2020 14.8  12.0 - 15.0 g/dL Final  . HCT 10/12/2020 45.4  36.0 - 46.0 % Final  . MCV 10/12/2020 94.4  80.0 - 100.0 fL Final  . MCH 10/12/2020 30.8  26.0 - 34.0 pg Final  . MCHC 10/12/2020 32.6  30.0 - 36.0 g/dL Final  . RDW 10/12/2020 12.5  11.5 - 15.5 % Final  . Platelets 10/12/2020 239  150 - 400 K/uL Final  . nRBC 10/12/2020 0.0  0.0 - 0.2 % Final   Performed at [facility], [place] [address], [place], [place] [address]  . Sodium 10/12/2020 140  135 - 145 mmol/L Final  . Potassium 10/12/2020 4.8  3.5 - 5.1 mmol/L Final  . Chloride 10/12/2020 107  98 - 111 mmol/L Final  . CO2 10/12/2020 23  22 - 32 mmol/L Final  . Glucose, Bld 10/12/2020 89  70 - 99 mg/dL Final   Glucose reference range applies only to samples taken after fasting for at least 8 hours.  . BUN 10/12/2020 20  8 - 23 mg/dL Final  . Creatinine, Ser 10/12/2020 0.75  0.44 - 1.00 mg/dL Final  . Calcium 10/12/2020 9.7  8.9 - 10.3 mg/dL Final  . Total Protein 10/12/2020 7.5  6.5 - 8.1 g/dL Final  . Albumin 10/12/2020 4.3  3.5 - 5.0 g/dL Final  . AST 10/12/2020 19  15 - 41 U/L Final  . ALT 10/12/2020 19  0 - 44 U/L Final  . Alkaline Phosphatase 10/12/2020 60  38 - 126 U/L Final  . Total Bilirubin 10/12/2020 0.4  0.3 - 1.2 mg/dL Final  . GFR, Estimated 10/12/2020 >60  >60 mL/min Final   Comment: (NOTE) Calculated using the CKD-EPI Creatinine Equation (2021)   . Anion gap 10/12/2020 10  5 - 15 Final   Performed at [facility], [place] [address], [place], [place] [address]  . Prothrombin Time 10/12/2020 12.6  11.4 - 15.2 seconds Final  . INR 10/12/2020 1.0  0.8 - 1.2 Final   Comment: (NOTE) INR goal varies based on device and disease states. Performed at [facility], [place] [address], [place], [place] [address]   . aPTT  10/12/2020 27  24 - 36 seconds Final   Performed at [facility], [place] [address], [place], [place] [address]  . ABO/RH(D) 10/12/2020 AB POS   Final  . Antibody Screen 10/12/2020 NEG   Final  . Sample Expiration 10/12/2020 10/24/2020,2359   Final  . Extend sample reason 10/12/2020    Final                   Value:NO TRANSFUSIONS OR PREGNANCY IN THE PAST 3 MONTHS Performed  at [facility], [place] [address] [place], [place] [address]   . MRSA, PCR 10/12/2020 NEGATIVE  NEGATIVE Final  . Staphylococcus aureus 10/12/2020 NEGATIVE  NEGATIVE Final   Comment: (NOTE) The Xpert SA Assay (FDA approved for NASAL specimens in patients 33 years of age and older), is one component of a comprehensive surveillance program. It is not intended to diagnose infection nor to guide or monitor treatment. Performed at [facility], [place] [address], [place], [place] [address]      X-Rays:DG Pelvis Portable  Result Date: 10/21/2020 CLINICAL DATA:  Status post left total hip arthroplasty. EXAM: PORTABLE PELVIS 1-2 VIEWS COMPARISON:  November 13, 2019. FINDINGS: Patient is status post interval left total hip arthroplasty. The left acetabular and femoral components are well situated. Expected postoperative changes are seen in the surrounding soft tissues. IMPRESSION: Status post interval left total hip arthroplasty. Electronically Signed   By: Marijo Conception M.D.   On: 10/21/2020 17:06   DG C-Arm 1-60 Min-No Report  Result Date: 10/21/2020 Fluoroscopy was utilized by the requesting physician.  No radiographic interpretation.   DG HIP UNILAT WITH PELVIS 1V LEFT  Result Date: 10/21/2020 CLINICAL DATA:  Left hip replacement. EXAM: DG HIP (WITH OR WITHOUT PELVIS) 1V*L*; DG C-ARM 1-60 MIN-NO REPORT Radiation exposure index: 0.8467 mGy. COMPARISON:  None. FINDINGS: Four intraoperative fluoroscopic images were obtained of the left hip. The left acetabular and femoral  components appear to be well situated. IMPRESSION: Fluoroscopic guidance provided during left total hip arthroplasty. Electronically Signed   By: Marijo Conception M.D.   On: 10/21/2020 17:05    EKG: Orders placed or performed during the hospital encounter of 11/05/19  . EKG 12-Lead  . EKG 12-Lead     Hospital Course: Denise Steele is a 65 y.o. who was admitted to [facility]. They were brought to the operating room on 10/21/2020 and underwent Procedure(s): [place].  Patient tolerated the procedure well and was later transferred to the recovery room and then to the orthopaedic floor for postoperative care. They were given PO and IV analgesics for pain control following their surgery. They were given 24 hours of postoperative antibiotics of  Anti-infectives (From admission, onward)   Start     Dose/Rate Route Frequency Ordered Stop   10/21/20 2030  ceFAZolin (ANCEF) IVPB 2g/100 mL premix        2 g 200 mL/hr over 30 Minutes Intravenous Every 6 hours 10/21/20 1644 10/22/20 0252   10/21/20 1215  ceFAZolin (ANCEF) IVPB 2g/100 mL premix        2 g 200 mL/hr over 30 Minutes Intravenous On call to O.R. 10/21/20 1206 10/21/20 1430     and started on DVT prophylaxis in the form of Aspirin and TED hose.   PT and OT were ordered for total joint protocol. Discharge planning consulted to help with postop disposition and equipment needs.  Patient had an uneventful night on the evening of surgery. They started to get up OOB with therapy on 10/21/20. Pt was seen during rounds and was ready to go home pending progress with therapy. She worked with therapy on POD #1 and was meeting goals. Pt was discharged to home later that day in stable condition.  Diet: Regular diet Activity: WBAT Follow-up: in two weeks Disposition: Home Discharged Condition: good   Discharge Instructions    Call MD / Call 911   Complete by: As directed    If you  experience chest pain or  shortness of breath, CALL 911 and be transported to the hospital emergency room.  If you develope a fever above 101 F, pus (white drainage) or increased drainage or redness at the wound, or calf pain, call your surgeon's office.   Change dressing   Complete by: As directed    You have an adhesive waterproof bandage over the incision. Leave this in place until your first follow-up appointment. Once you remove this you will not need to place another bandage.   Constipation Prevention   Complete by: As directed    Drink plenty of fluids.  Prune juice may be helpful.  You may use a stool softener, such as Colace (over the counter) 100 mg twice a day.  Use MiraLax (over the counter) for constipation as needed.   Diet - low sodium heart healthy   Complete by: As directed    Do not sit on low chairs, stoools or toilet seats, as it may be difficult to get up from low surfaces   Complete by: As directed    Driving restrictions   Complete by: As directed    No driving for two weeks   TED hose   Complete by: As directed    Use stockings (TED hose) for three weeks on both leg(s).  You may remove them at night for sleeping.   Weight bearing as tolerated   Complete by: As directed      Allergies as of 10/22/2020      Reactions   Penicillins Hives   Did it involve swelling of the face/tongue/throat, SOB, or low BP? Unknown Did it involve sudden or severe rash/hives, skin peeling, or any reaction on the inside of your mouth or nose? Yes Did you need to seek medical attention at a hospital or doctor's office? No When did it last happen? Childhood reaction. If all above answers are "NO", may proceed with cephalosporin use. Tolerated Cephalosporin Date: 11/13/19.      Medication List    STOP taking these medications   ibuprofen 200 MG tablet Commonly known as: ADVIL     TAKE these medications   aspirin 325 MG EC tablet Take 1 tablet (325 mg total) by mouth 2 (two) times daily. Then take one 81  mg aspirin once a day for three weeks. Then discontinue aspirin. What changed:   medication strength  how much to take  when to take this  additional instructions   Biotin 5 MG Caps Take 5 mg by mouth daily.   Dialyvite Vitamin D 5000 125 MCG (5000 UT) capsule Generic drug: Cholecalciferol Take 5,000 Units by mouth daily.   estradiol 0.5 MG tablet Commonly known as: ESTRACE Take 0.5 mg by mouth daily.   fexofenadine 180 MG tablet Commonly known as: ALLEGRA Take 180 mg by mouth daily.   HYDROcodone-acetaminophen 5-325 MG tablet Commonly known as: NORCO/VICODIN Take 1-2 tablets by mouth every 6 (six) hours as needed for severe pain.   lisinopril 30 MG tablet Commonly known as: ZESTRIL Take 30 mg by mouth daily.   methocarbamol 500 MG tablet Commonly known as: ROBAXIN Take 1 tablet (500 mg total) by mouth every 6 (six) hours as needed for muscle spasms.   OMEGA 3 PO Take 1 capsule by mouth daily.   traMADol 50 MG tablet Commonly known as: ULTRAM Take 1-2 tablets (50-100 mg total) by mouth every 6 (six) hours as needed for moderate pain.  Discharge Care Instructions  (From admission, onward)         Start     Ordered   10/22/20 0000  Weight bearing as tolerated        10/22/20 0819   10/22/20 0000  Change dressing       Comments: You have an adhesive waterproof bandage over the incision. Leave this in place until your first follow-up appointment. Once you remove this you will not need to place another bandage.   10/22/20 0370           Signed: Fenton Foy, MBA, PA-C Orthopedic Surgery 10/28/2020, 7:42 AM

## 2020-11-24 DIAGNOSIS — Z96642 Presence of left artificial hip joint: Secondary | ICD-10-CM | POA: Diagnosis not present

## 2020-11-24 DIAGNOSIS — Z471 Aftercare following joint replacement surgery: Secondary | ICD-10-CM | POA: Diagnosis not present

## 2020-11-30 DIAGNOSIS — Z1321 Encounter for screening for nutritional disorder: Secondary | ICD-10-CM | POA: Diagnosis not present

## 2020-11-30 DIAGNOSIS — Z1231 Encounter for screening mammogram for malignant neoplasm of breast: Secondary | ICD-10-CM | POA: Diagnosis not present

## 2020-12-10 DIAGNOSIS — M9901 Segmental and somatic dysfunction of cervical region: Secondary | ICD-10-CM | POA: Diagnosis not present

## 2020-12-10 DIAGNOSIS — M47812 Spondylosis without myelopathy or radiculopathy, cervical region: Secondary | ICD-10-CM | POA: Diagnosis not present

## 2020-12-10 DIAGNOSIS — S233XXA Sprain of ligaments of thoracic spine, initial encounter: Secondary | ICD-10-CM | POA: Diagnosis not present

## 2020-12-10 DIAGNOSIS — M531 Cervicobrachial syndrome: Secondary | ICD-10-CM | POA: Diagnosis not present

## 2020-12-14 DIAGNOSIS — M47812 Spondylosis without myelopathy or radiculopathy, cervical region: Secondary | ICD-10-CM | POA: Diagnosis not present

## 2020-12-14 DIAGNOSIS — M9901 Segmental and somatic dysfunction of cervical region: Secondary | ICD-10-CM | POA: Diagnosis not present

## 2020-12-14 DIAGNOSIS — S233XXA Sprain of ligaments of thoracic spine, initial encounter: Secondary | ICD-10-CM | POA: Diagnosis not present

## 2020-12-14 DIAGNOSIS — M531 Cervicobrachial syndrome: Secondary | ICD-10-CM | POA: Diagnosis not present

## 2020-12-16 DIAGNOSIS — M9901 Segmental and somatic dysfunction of cervical region: Secondary | ICD-10-CM | POA: Diagnosis not present

## 2020-12-16 DIAGNOSIS — M47812 Spondylosis without myelopathy or radiculopathy, cervical region: Secondary | ICD-10-CM | POA: Diagnosis not present

## 2020-12-16 DIAGNOSIS — M531 Cervicobrachial syndrome: Secondary | ICD-10-CM | POA: Diagnosis not present

## 2020-12-16 DIAGNOSIS — S233XXA Sprain of ligaments of thoracic spine, initial encounter: Secondary | ICD-10-CM | POA: Diagnosis not present

## 2020-12-22 DIAGNOSIS — M531 Cervicobrachial syndrome: Secondary | ICD-10-CM | POA: Diagnosis not present

## 2020-12-22 DIAGNOSIS — M9901 Segmental and somatic dysfunction of cervical region: Secondary | ICD-10-CM | POA: Diagnosis not present

## 2020-12-22 DIAGNOSIS — S233XXA Sprain of ligaments of thoracic spine, initial encounter: Secondary | ICD-10-CM | POA: Diagnosis not present

## 2020-12-22 DIAGNOSIS — M47812 Spondylosis without myelopathy or radiculopathy, cervical region: Secondary | ICD-10-CM | POA: Diagnosis not present

## 2020-12-24 DIAGNOSIS — M47812 Spondylosis without myelopathy or radiculopathy, cervical region: Secondary | ICD-10-CM | POA: Diagnosis not present

## 2020-12-24 DIAGNOSIS — S233XXA Sprain of ligaments of thoracic spine, initial encounter: Secondary | ICD-10-CM | POA: Diagnosis not present

## 2020-12-24 DIAGNOSIS — M531 Cervicobrachial syndrome: Secondary | ICD-10-CM | POA: Diagnosis not present

## 2020-12-24 DIAGNOSIS — M9901 Segmental and somatic dysfunction of cervical region: Secondary | ICD-10-CM | POA: Diagnosis not present

## 2020-12-26 ENCOUNTER — Emergency Department (HOSPITAL_COMMUNITY): Payer: Medicare Other

## 2020-12-26 ENCOUNTER — Emergency Department (HOSPITAL_COMMUNITY)
Admission: EM | Admit: 2020-12-26 | Discharge: 2020-12-26 | Disposition: A | Discharge status: Home / Self Care | Payer: Medicare Other | Attending: Emergency Medicine | Admitting: Emergency Medicine

## 2020-12-26 ENCOUNTER — Other Ambulatory Visit: Payer: Self-pay

## 2020-12-26 DIAGNOSIS — Z743 Need for continuous supervision: Secondary | ICD-10-CM | POA: Diagnosis not present

## 2020-12-26 DIAGNOSIS — Z79899 Other long term (current) drug therapy: Secondary | ICD-10-CM | POA: Diagnosis not present

## 2020-12-26 DIAGNOSIS — I10 Essential (primary) hypertension: Secondary | ICD-10-CM | POA: Insufficient documentation

## 2020-12-26 DIAGNOSIS — Z96643 Presence of artificial hip joint, bilateral: Secondary | ICD-10-CM | POA: Diagnosis not present

## 2020-12-26 DIAGNOSIS — S73005A Unspecified dislocation of left hip, initial encounter: Secondary | ICD-10-CM | POA: Diagnosis not present

## 2020-12-26 DIAGNOSIS — R0689 Other abnormalities of breathing: Secondary | ICD-10-CM | POA: Diagnosis not present

## 2020-12-26 DIAGNOSIS — W19XXXA Unspecified fall, initial encounter: Secondary | ICD-10-CM | POA: Diagnosis not present

## 2020-12-26 DIAGNOSIS — Y93E1 Activity, personal bathing and showering: Secondary | ICD-10-CM | POA: Insufficient documentation

## 2020-12-26 DIAGNOSIS — X501XXA Overexertion from prolonged static or awkward postures, initial encounter: Secondary | ICD-10-CM | POA: Diagnosis not present

## 2020-12-26 DIAGNOSIS — Z96642 Presence of left artificial hip joint: Secondary | ICD-10-CM | POA: Diagnosis not present

## 2020-12-26 DIAGNOSIS — Z87891 Personal history of nicotine dependence: Secondary | ICD-10-CM | POA: Diagnosis not present

## 2020-12-26 DIAGNOSIS — S79912A Unspecified injury of left hip, initial encounter: Secondary | ICD-10-CM | POA: Diagnosis present

## 2020-12-26 DIAGNOSIS — Z471 Aftercare following joint replacement surgery: Secondary | ICD-10-CM | POA: Diagnosis not present

## 2020-12-26 DIAGNOSIS — T84021A Dislocation of internal left hip prosthesis, initial encounter: Secondary | ICD-10-CM | POA: Diagnosis not present

## 2020-12-26 MED ORDER — SODIUM CHLORIDE 0.9 % IV BOLUS
1000.0000 mL | Freq: Once | INTRAVENOUS | Status: AC
  Administered 2020-12-26: 1000 mL via INTRAVENOUS

## 2020-12-26 MED ORDER — PROPOFOL 10 MG/ML IV BOLUS
1.0000 mg/kg | Freq: Once | INTRAVENOUS | Status: AC
  Administered 2020-12-26: 81.6 mg via INTRAVENOUS
  Filled 2020-12-26: qty 20

## 2020-12-26 MED ORDER — ONDANSETRON 4 MG PO TBDP: 4.0000 mg | ORAL_TABLET | Freq: Three times a day (TID) | ORAL | 0 refills | Status: DC | PRN

## 2020-12-26 MED ORDER — HYDROMORPHONE HCL 1 MG/ML IJ SOLN
1.0000 mg | Freq: Once | INTRAMUSCULAR | Status: AC
  Administered 2020-12-26: 1 mg via INTRAVENOUS
  Filled 2020-12-26: qty 1

## 2020-12-26 MED ORDER — FENTANYL CITRATE (PF) 100 MCG/2ML IJ SOLN
50.0000 ug | Freq: Once | INTRAMUSCULAR | Status: AC
  Administered 2020-12-26: 50 ug via INTRAVENOUS
  Filled 2020-12-26: qty 2

## 2020-12-26 MED ORDER — PROPOFOL 10 MG/ML IV BOLUS
INTRAVENOUS | Status: AC | PRN
  Administered 2020-12-26: 80 mg via INTRAVENOUS

## 2020-12-26 MED ORDER — ONDANSETRON HCL 4 MG/2ML IJ SOLN
4.0000 mg | Freq: Once | INTRAMUSCULAR | Status: AC
  Administered 2020-12-26: 4 mg via INTRAVENOUS
  Filled 2020-12-26: qty 2

## 2020-12-26 NOTE — Progress Notes (Signed)
Orthopedic Tech Progress Note Patient Details:  [name] 03/12/1956 [id number]  Ortho Devices Type of Ortho Device: Knee Immobilizer Ortho Device/Splint Location: left reduction Ortho Device/Splint Interventions: Application   Post Interventions Patient Tolerated: Well Instructions Provided: Care of device   [place] Pink 12/26/2020, 11:22 AM

## 2020-12-26 NOTE — ED Notes (Signed)
Patient underwent a conscious sedation in which a total of 80 mg of Propofal was administer. Hip was reduced and knee immobilizer placed. Patient did receive bag ventilation. Post xray was performed and patient educated on post care by the MD.

## 2020-12-26 NOTE — Discharge Instructions (Addendum)
Do not bear weight on you left leg until cleared by your orthopedist

## 2020-12-26 NOTE — ED Provider Notes (Signed)
[facility] DEPT Provider Note   CSN: [id number] Arrival date & time: 12/26/20  4098     History Chief Complaint  Patient presents with  . Hip Pain    Denise Steele is a 65 y.o. female.  HPI 65 year old female presents with left hip pain.  At the end of March she had her hip replaced.  She is been doing well since.  Today she was turning to look out the window and felt a pop and sudden left hip pain.  Has not been able to walk since.  Pain is rated as severe.  No direct trauma.  No numbness or weakness. Last ate this morning around 5:30 AM.  Past Medical History:  Diagnosis Date  . Arthritis    hips and knees  . History of kidney stones   . Hypertension   . Medical history non-contributory   . PONV (postoperative nausea and vomiting)     Patient Active Problem List   Diagnosis Date Noted  . Osteoarthritis of left hip 10/21/2020  . OA (osteoarthritis) of hip 11/13/2019  . Status post right hip replacement 11/13/2019    Past Surgical History:  Procedure Laterality Date  . ANKLE FRACTURE SURGERY Right   . CYSTOSCOPY N/A 05/30/2013   Procedure: CYSTOSCOPY;  Surgeon: Logan Bores, MD;  Location: [facility];  Service: Gynecology;  Laterality: N/A;  . ENDOMETRIAL ABLATION    . FRACTURE SURGERY Right 2011   leg  . kidney stone removal    . LAPAROSCOPIC ASSISTED VAGINAL HYSTERECTOMY N/A 05/30/2013   Procedure: LAPAROSCOPIC ASSISTED VAGINAL HYSTERECTOMY;  Surgeon: Logan Bores, MD;  Location: [facility];  Service: Gynecology;  Laterality: N/A;  abdomen and vagins  . SALPINGOOPHORECTOMY N/A 05/30/2013   Procedure: SALPINGO OOPHORECTOMY;  Surgeon: Logan Bores, MD;  Location: [facility];  Service: Gynecology;  Laterality: N/A;  . TONSILLECTOMY    . TOTAL HIP ARTHROPLASTY Right 11/13/2019   Procedure: TOTAL HIP ARTHROPLASTY ANTERIOR APPROACH;  Surgeon: Gaynelle Arabian, MD;  Location: [facility];  Service: Orthopedics;  Laterality: Right;  116min  .  TOTAL HIP ARTHROPLASTY Left 10/21/2020   Procedure: TOTAL HIP ARTHROPLASTY ANTERIOR APPROACH;  Surgeon: Gaynelle Arabian, MD;  Location: [facility];  Service: Orthopedics;  Laterality: Left;  172min     OB History   No obstetric history on file.     No family history on file.  Social History   Tobacco Use  . Smoking status: Former Smoker    Packs/day: 0.25    Years: 15.00    Pack years: 3.75    Types: Cigarettes    Quit date: 1985    Years since quitting: 37.4  . Smokeless tobacco: Never Used  . Tobacco comment: quit over 35 years  Vaping Use  . Vaping Use: Never used  Substance Use Topics  . Alcohol use: Yes    Comment: occ. glass of wine  . Drug use: No    Home Medications Prior to Admission medications   Medication Sig Start Date End Date Taking? Authorizing Provider  Biotin 5 MG CAPS Take 5 mg by mouth daily.   Yes [provider]  Cholecalciferol (DIALYVITE VITAMIN D 5000) 125 MCG (5000 UT) capsule Take 5,000 Units by mouth daily.   Yes [provider]  estradiol (ESTRACE) 0.5 MG tablet Take 0.5 mg by mouth daily.   Yes [provider]  fexofenadine (ALLEGRA) 180 MG tablet Take 180 mg by mouth daily.   Yes [provider]  ibuprofen (ADVIL) 200 MG tablet Take 400 mg by mouth every 6 (six) hours as needed for mild pain.   Yes [provider]  lisinopril (ZESTRIL) 30 MG tablet Take 30 mg by mouth daily. 10/14/19  Yes [provider]  Omega 3 1000 MG CAPS Take 1,000 mg by mouth daily.   Yes [provider]  ondansetron (ZOFRAN ODT) 4 MG disintegrating tablet Take 1 tablet (4 mg total) by mouth every 8 (eight) hours as needed for nausea or vomiting. 12/26/20  Yes Sherwood Gambler, MD  aspirin EC 325 MG EC tablet Take 1 tablet (325 mg total) by mouth 2 (two) times daily. Then take one 81 mg aspirin once a day for three weeks. Then discontinue aspirin. Patient not taking: Reported on 12/26/2020 10/22/20   Jonnie Kind,  PA-C  HYDROcodone-acetaminophen (NORCO/VICODIN) 5-325 MG tablet Take 1-2 tablets by mouth every 6 (six) hours as needed for severe pain. Patient not taking: Reported on 12/26/2020 10/22/20   Jonnie Kind, PA-C  methocarbamol (ROBAXIN) 500 MG tablet Take 1 tablet (500 mg total) by mouth every 6 (six) hours as needed for muscle spasms. Patient not taking: Reported on 12/26/2020 10/22/20   Fenton Foy D, PA-C  traMADol (ULTRAM) 50 MG tablet Take 1-2 tablets (50-100 mg total) by mouth every 6 (six) hours as needed for moderate pain. Patient not taking: Reported on 12/26/2020 10/22/20   Jonnie Kind, PA-C    Allergies    Penicillins  Review of Systems   Review of Systems  Musculoskeletal: Positive for arthralgias.  Neurological: Negative for weakness and numbness.  All other systems reviewed and are negative.   Physical Exam Updated Vital Signs BP 140/68   Pulse (!) 56   Temp 97.6 F (36.4 C) (Oral)   Resp 18   Ht 5\' 6"  (1.676 m)   Wt 81.6 kg   SpO2 97%   BMI 29.05 kg/m   Physical Exam Vitals and nursing note reviewed.  Constitutional:      Appearance: She is well-developed.  HENT:     Head: Normocephalic and atraumatic.     Right Ear: External ear normal.     Left Ear: External ear normal.     Nose: Nose normal.  Eyes:     General:        Right eye: No discharge.        Left eye: No discharge.  Cardiovascular:     Rate and Rhythm: Normal rate and regular rhythm.     Pulses:          Dorsalis pedis pulses are 2+ on the left side.  Pulmonary:     Effort: Pulmonary effort is normal.  Abdominal:     General: There is no distension.  Musculoskeletal:     Left hip: Tenderness present. Decreased range of motion.     Left upper leg: No tenderness.     Left knee: No tenderness.     Comments: Normal movement of left foot, normal sensation  Skin:    General: Skin is warm and dry.  Neurological:     Mental Status: She is alert.  Psychiatric:        Mood and  Affect: Mood is not anxious.     ED Results / Procedures / Treatments   Labs (all labs ordered are listed, but only abnormal results are displayed) Labs Reviewed - No data to display  EKG None  Radiology DG Hip Unilat W or Wo Pelvis 1 View  Left  Result Date: 12/26/2020 CLINICAL DATA:  Left hip replacement in March 22. Patient felt a hip pop, followed by pain. No fall. EXAM: DG HIP (WITH OR WITHOUT PELVIS) 1V*L* COMPARISON:  10/21/2020. FINDINGS: The left hip arthroplasty is dislocated. The femoral component has dislocated anterior and slightly superior to the acetabular component. Right total hip arthroplasty is well aligned. All prosthetic components appear well seated, without evidence of loosening. No fracture. Soft tissues are unremarkable. IMPRESSION: 1. Dislocated left hip arthroplasty.  No fracture. Electronically Signed   By: Lajean Manes M.D.   On: 12/26/2020 09:50   DG Hip Port Unilat W or Wo Pelvis 1 View Left  Result Date: 12/26/2020 CLINICAL DATA:  Status post left hip dislocation reduction EXAM: DG HIP (WITH OR WITHOUT PELVIS) 1V PORT LEFT COMPARISON:  None. FINDINGS: Left total hip arthroplasty. Interval reduction of left hip dislocation reduction. No active dislocation. IMPRESSION: Successful left hip dislocation reduction.  Normal alignment. Electronically Signed   By: Kathreen Devoid   On: 12/26/2020 12:10   DG Femur Min 2 Views Left  Result Date: 12/26/2020 CLINICAL DATA:  Post reduction of left arthroplasty dislocation. EXAM: LEFT FEMUR 2 VIEWS COMPARISON:  Exam is obtained earlier today. FINDINGS: The femoral and acetabular components of the left hip total arthroplasty are now well aligned, fully reduced. No fracture.  No evidence of prosthetic component loosening. Knee joint normally aligned. IMPRESSION: Successful reduction of the dislocated left hip prosthesis. No fractures. Electronically Signed   By: Lajean Manes M.D.   On: 12/26/2020 13:05     Procedures .Sedation  Date/Time: 12/26/2020 11:51 AM Performed by: Sherwood Gambler, MD Authorized by: Sherwood Gambler, MD   Consent:    Consent obtained:  Verbal and written   Consent given by:  Patient   Risks discussed:  Allergic reaction, dysrhythmia, inadequate sedation, vomiting, respiratory compromise necessitating ventilatory assistance and intubation, prolonged sedation necessitating reversal, prolonged hypoxia resulting in organ damage and nausea Indications:    Procedure performed:  Dislocation reduction   Procedure necessitating sedation performed by:  Physician performing sedation Pre-sedation assessment:    Time since last food or drink:  6 hours   ASA classification: class 2 - patient with mild systemic disease     Mallampati score:  I - soft palate, uvula, fauces, pillars visible   Pre-sedation assessments completed and reviewed: airway patency, cardiovascular function, hydration status, mental status, nausea/vomiting, pain level, respiratory function and temperature   Immediate pre-procedure details:    Reviewed: vital signs, relevant labs/tests and NPO status     Verified: bag valve mask available, emergency equipment available, intubation equipment available, IV patency confirmed, oxygen available and suction available   Procedure details (see MAR for exact dosages):    Preoxygenation:  Nasal cannula   Sedation:  Propofol   Intended level of sedation: deep   Intra-procedure monitoring:  Blood pressure monitoring, cardiac monitor, continuous pulse oximetry, continuous capnometry, frequent LOC assessments and frequent vital sign checks   Intra-procedure events: respiratory depression     Intra-procedure management:  BVM ventilation   Total Provider sedation time (minutes):  8 Post-procedure details:    Post-sedation assessments completed and reviewed: airway patency, cardiovascular function, hydration status, mental status, nausea/vomiting, pain level, respiratory  function and temperature     Patient is stable for discharge or admission: yes     Procedure completion:  Tolerated well, no immediate complications Reduction of dislocation  Date/Time: 12/26/2020 11:52 AM Performed by: Sherwood Gambler, MD Authorized by: Sherwood Gambler,  MD  Consent: Verbal consent obtained. Written consent obtained. Risks and benefits: risks, benefits and alternatives were discussed Consent given by: patient Time out: Immediately prior to procedure a "time out" was called to verify the correct patient, procedure, equipment, support staff and site/side marked as required. Preparation: Patient was prepped and draped in the usual sterile fashion. Local anesthesia used: no  Anesthesia: Local anesthesia used: no  Sedation: Patient sedated: yes  Patient tolerance: patient tolerated the procedure well with no immediate complications Comments: After sedation, left hip reduced easily with gentle flexion and traction      Medications Ordered in ED Medications  HYDROmorphone (DILAUDID) injection 1 mg (1 mg Intravenous Given 12/26/20 0918)  sodium chloride 0.9 % bolus 1,000 mL (0 mLs Intravenous Stopped 12/26/20 1353)  propofol (DIPRIVAN) 10 mg/mL bolus/IV push 81.6 mg (81.6 mg Intravenous Given 12/26/20 1139)  fentaNYL (SUBLIMAZE) injection 50 mcg (50 mcg Intravenous Given 12/26/20 1139)  propofol (DIPRIVAN) 10 mg/mL bolus/IV push (80 mg Intravenous Given 12/26/20 1145)  ondansetron (ZOFRAN) injection 4 mg (4 mg Intravenous Given 12/26/20 1353)    ED Course  I have reviewed the triage vital signs and the nursing notes.  Pertinent labs & imaging results that were available during my care of the patient were reviewed by me and considered in my medical decision making (see chart for details).    MDM Rules/Calculators/A&P                          Patient has remained neurovascular intact.  Hip was easily reduced as above with sedation.  Now has full range of motion on post  reduction exam.  Placed in knee immobilizer.  I did discuss with on-call, Dr. Rolena Infante, who asked for a full-length femur film as well to make sure that no injuries were associated with the reduction.  This is unremarkable.  Patient did have some nausea afterwards and was given Zofran with good response.  She states she often gets nauseated after anesthesia.  Will discharge home with return precautions to follow-up with Dr. Wynelle Link on May 31 Final Clinical Impression(s) / ED Diagnoses Final diagnoses:  Dislocation of left hip, initial encounter [facility])    Rx / DC Orders ED Discharge Orders         Ordered    ondansetron (ZOFRAN ODT) 4 MG disintegrating tablet  Every 8 hours PRN        12/26/20 1409           Sherwood Gambler, MD 12/26/20 1429

## 2020-12-26 NOTE — ED Triage Notes (Signed)
[facility] brings pt in from home for left hip pain. Pt reports taking a shower this morning and turned and felt a "pop" in her left hip.

## 2021-02-22 DIAGNOSIS — H35033 Hypertensive retinopathy, bilateral: Secondary | ICD-10-CM | POA: Diagnosis not present

## 2021-02-22 DIAGNOSIS — H43393 Other vitreous opacities, bilateral: Secondary | ICD-10-CM | POA: Diagnosis not present

## 2021-02-22 DIAGNOSIS — H33321 Round hole, right eye: Secondary | ICD-10-CM | POA: Diagnosis not present

## 2021-02-22 DIAGNOSIS — H35363 Drusen (degenerative) of macula, bilateral: Secondary | ICD-10-CM | POA: Diagnosis not present

## 2021-02-22 DIAGNOSIS — H43813 Vitreous degeneration, bilateral: Secondary | ICD-10-CM | POA: Diagnosis not present

## 2021-02-22 DIAGNOSIS — H524 Presbyopia: Secondary | ICD-10-CM | POA: Diagnosis not present

## 2021-03-01 DIAGNOSIS — H33321 Round hole, right eye: Secondary | ICD-10-CM | POA: Diagnosis not present

## 2021-03-22 DIAGNOSIS — G5601 Carpal tunnel syndrome, right upper limb: Secondary | ICD-10-CM | POA: Diagnosis not present

## 2021-03-29 DIAGNOSIS — Z87891 Personal history of nicotine dependence: Secondary | ICD-10-CM | POA: Diagnosis not present

## 2021-03-29 DIAGNOSIS — Z299 Encounter for prophylactic measures, unspecified: Secondary | ICD-10-CM | POA: Diagnosis not present

## 2021-03-29 DIAGNOSIS — U071 COVID-19: Secondary | ICD-10-CM | POA: Diagnosis not present

## 2021-03-29 DIAGNOSIS — I10 Essential (primary) hypertension: Secondary | ICD-10-CM | POA: Diagnosis not present

## 2021-04-08 DIAGNOSIS — G5601 Carpal tunnel syndrome, right upper limb: Secondary | ICD-10-CM | POA: Diagnosis not present

## 2021-04-12 DIAGNOSIS — G5601 Carpal tunnel syndrome, right upper limb: Secondary | ICD-10-CM | POA: Diagnosis not present

## 2021-04-26 DIAGNOSIS — H35033 Hypertensive retinopathy, bilateral: Secondary | ICD-10-CM | POA: Diagnosis not present

## 2021-04-26 DIAGNOSIS — H33321 Round hole, right eye: Secondary | ICD-10-CM | POA: Diagnosis not present

## 2021-04-26 DIAGNOSIS — H43813 Vitreous degeneration, bilateral: Secondary | ICD-10-CM | POA: Diagnosis not present

## 2021-04-26 DIAGNOSIS — H35363 Drusen (degenerative) of macula, bilateral: Secondary | ICD-10-CM | POA: Diagnosis not present

## 2021-04-30 DIAGNOSIS — M25561 Pain in right knee: Secondary | ICD-10-CM | POA: Diagnosis not present

## 2021-05-04 DIAGNOSIS — G5601 Carpal tunnel syndrome, right upper limb: Secondary | ICD-10-CM | POA: Diagnosis not present

## 2021-05-17 DIAGNOSIS — M25631 Stiffness of right wrist, not elsewhere classified: Secondary | ICD-10-CM | POA: Diagnosis not present

## 2021-06-23 DIAGNOSIS — M25561 Pain in right knee: Secondary | ICD-10-CM | POA: Diagnosis not present

## 2021-07-06 DIAGNOSIS — R509 Fever, unspecified: Secondary | ICD-10-CM | POA: Diagnosis not present

## 2021-07-06 DIAGNOSIS — I10 Essential (primary) hypertension: Secondary | ICD-10-CM | POA: Diagnosis not present

## 2021-07-06 DIAGNOSIS — Z299 Encounter for prophylactic measures, unspecified: Secondary | ICD-10-CM | POA: Diagnosis not present

## 2021-07-27 DIAGNOSIS — E7800 Pure hypercholesterolemia, unspecified: Secondary | ICD-10-CM | POA: Diagnosis not present

## 2021-07-27 DIAGNOSIS — R5383 Other fatigue: Secondary | ICD-10-CM | POA: Diagnosis not present

## 2021-07-27 DIAGNOSIS — J45901 Unspecified asthma with (acute) exacerbation: Secondary | ICD-10-CM | POA: Diagnosis not present

## 2021-07-27 DIAGNOSIS — Z79899 Other long term (current) drug therapy: Secondary | ICD-10-CM | POA: Diagnosis not present

## 2021-07-27 DIAGNOSIS — Z299 Encounter for prophylactic measures, unspecified: Secondary | ICD-10-CM | POA: Diagnosis not present

## 2021-07-27 DIAGNOSIS — Z0000 Encounter for general adult medical examination without abnormal findings: Secondary | ICD-10-CM | POA: Diagnosis not present

## 2021-07-27 DIAGNOSIS — I10 Essential (primary) hypertension: Secondary | ICD-10-CM | POA: Diagnosis not present

## 2021-07-27 DIAGNOSIS — Z7189 Other specified counseling: Secondary | ICD-10-CM | POA: Diagnosis not present

## 2021-07-29 DIAGNOSIS — M1711 Unilateral primary osteoarthritis, right knee: Secondary | ICD-10-CM | POA: Diagnosis not present

## 2021-07-29 DIAGNOSIS — S83281A Other tear of lateral meniscus, current injury, right knee, initial encounter: Secondary | ICD-10-CM | POA: Diagnosis not present

## 2021-09-18 IMAGING — CR DG HIP (WITH OR WITHOUT PELVIS) 1V*L*
3 series · 3 of 3 positions shown · non-contrast
Comparison: 10/21/2020.

CLINICAL DATA: Left hip replacement in [DATE]. Patient felt a hip
pop, followed by pain. No fall.

EXAM:
DG HIP (WITH OR WITHOUT PELVIS) 1V*L*

[x pelvis]
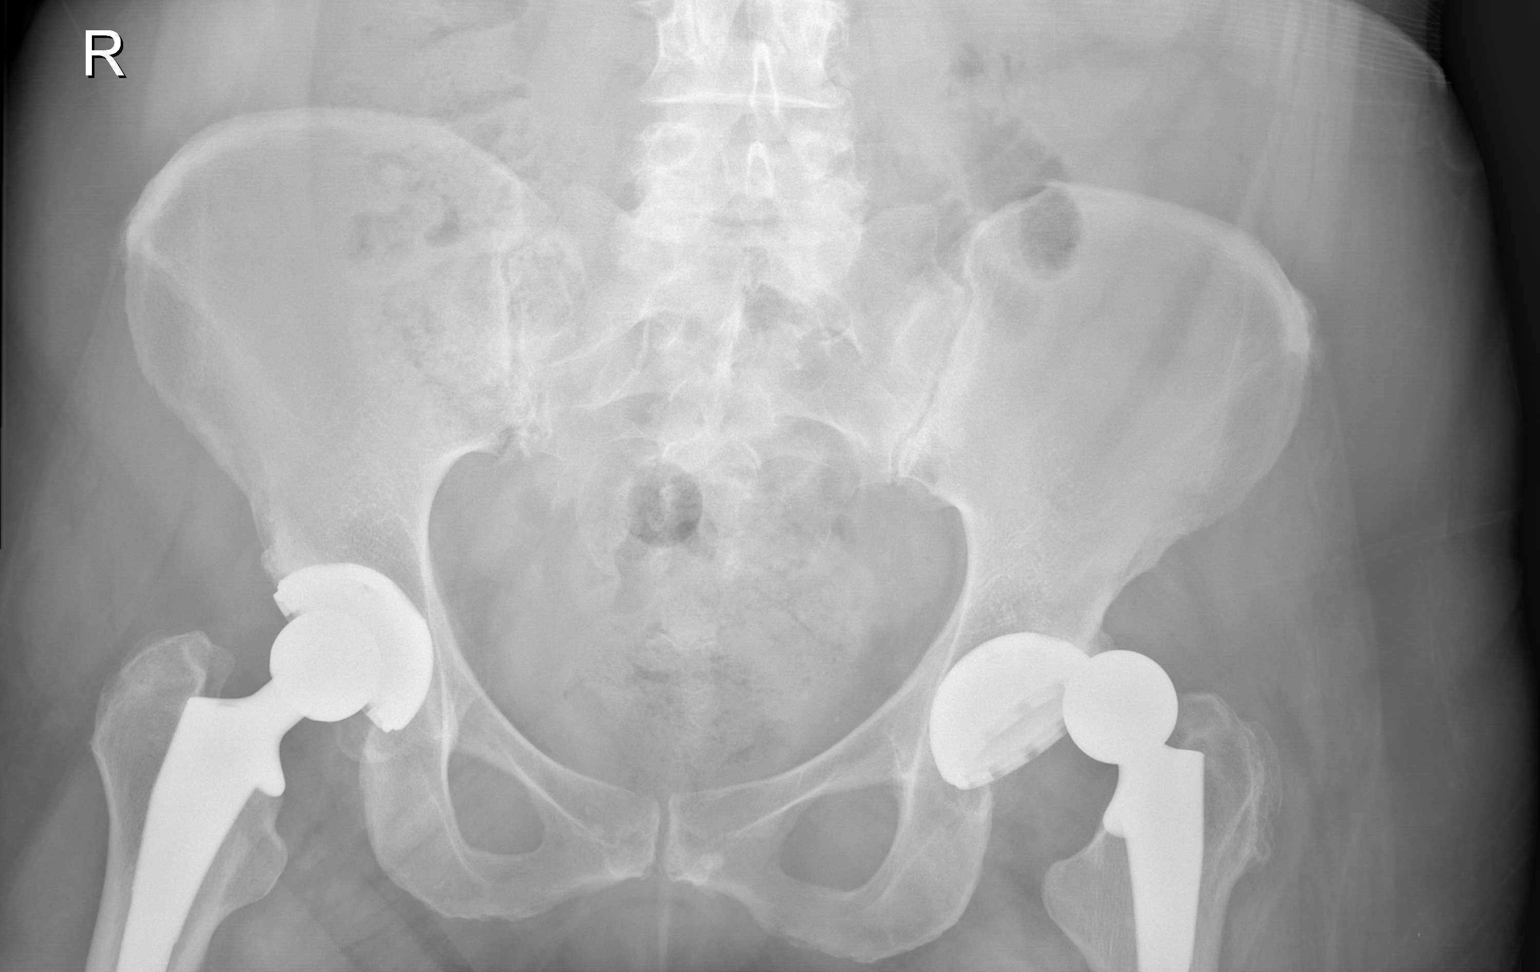

[x hip ap left]
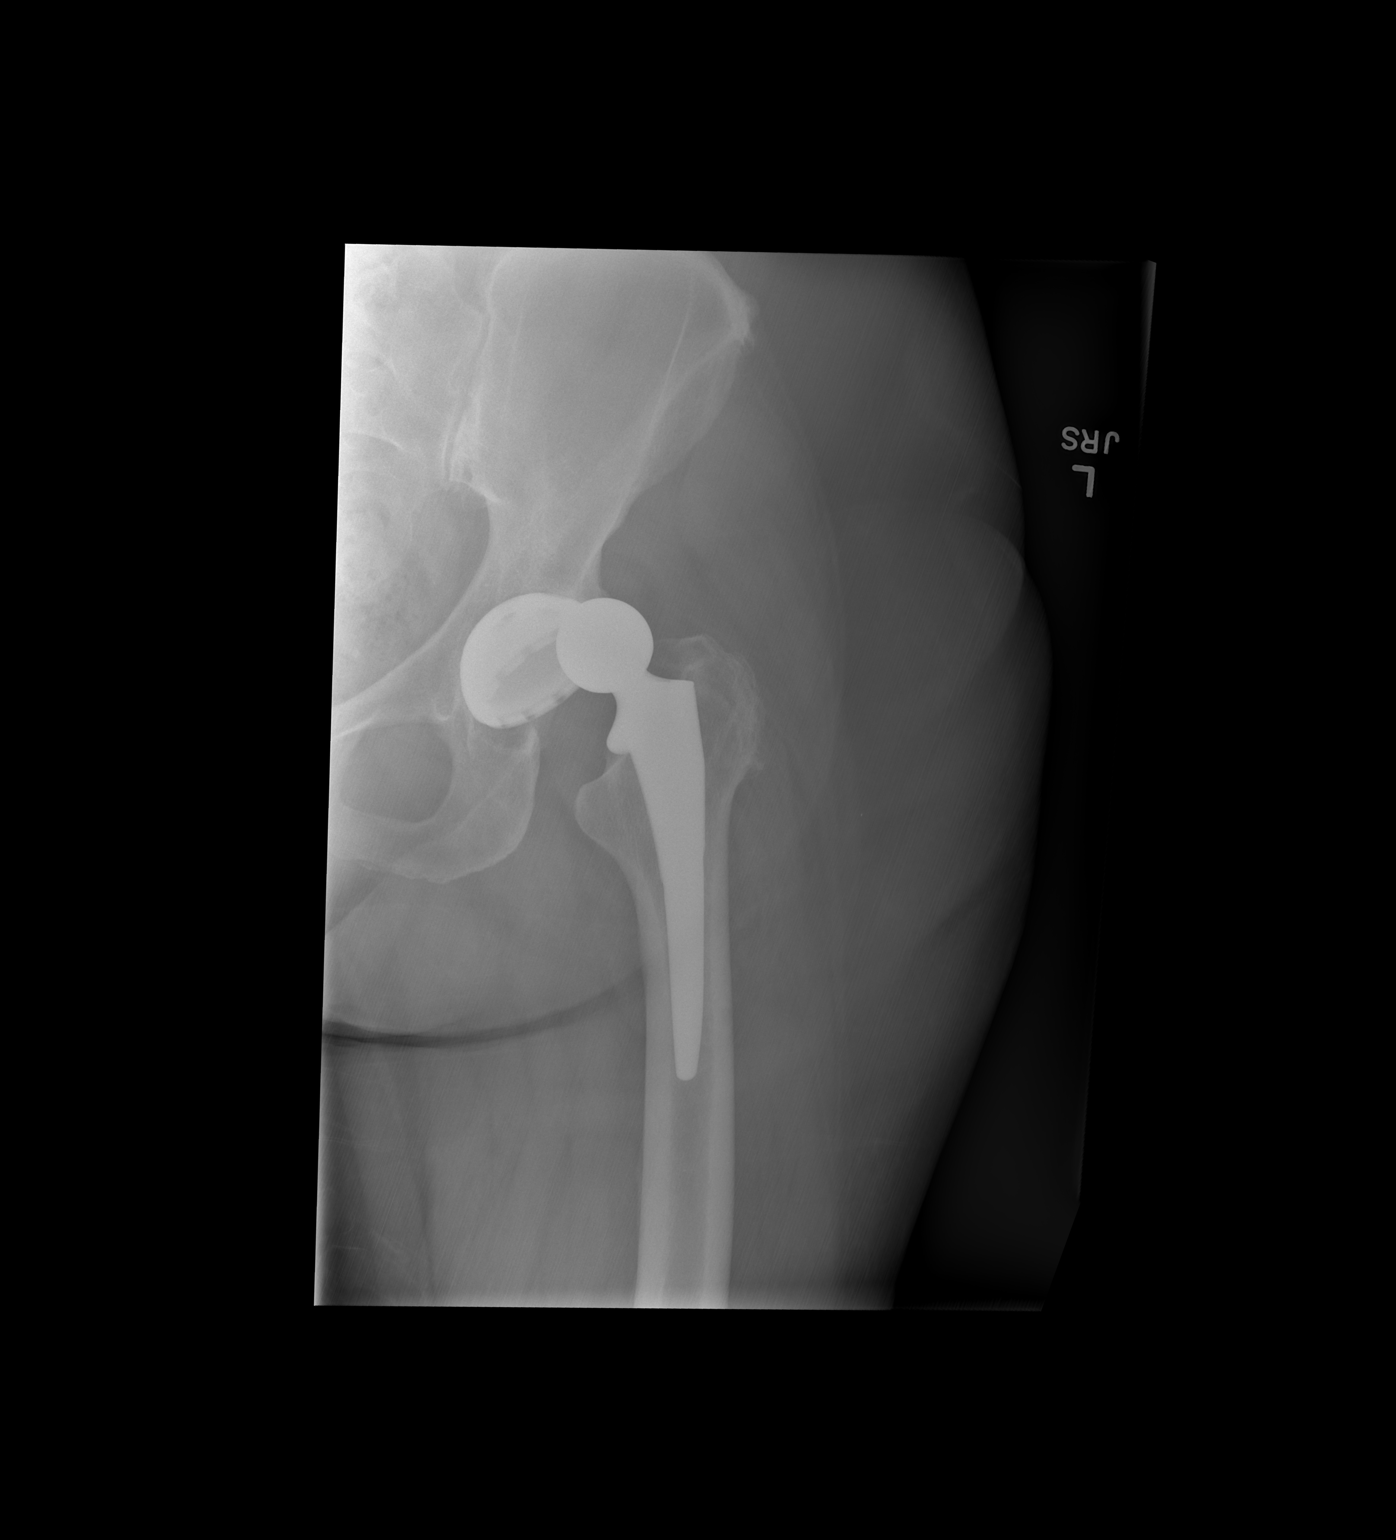

[w hip lat left]
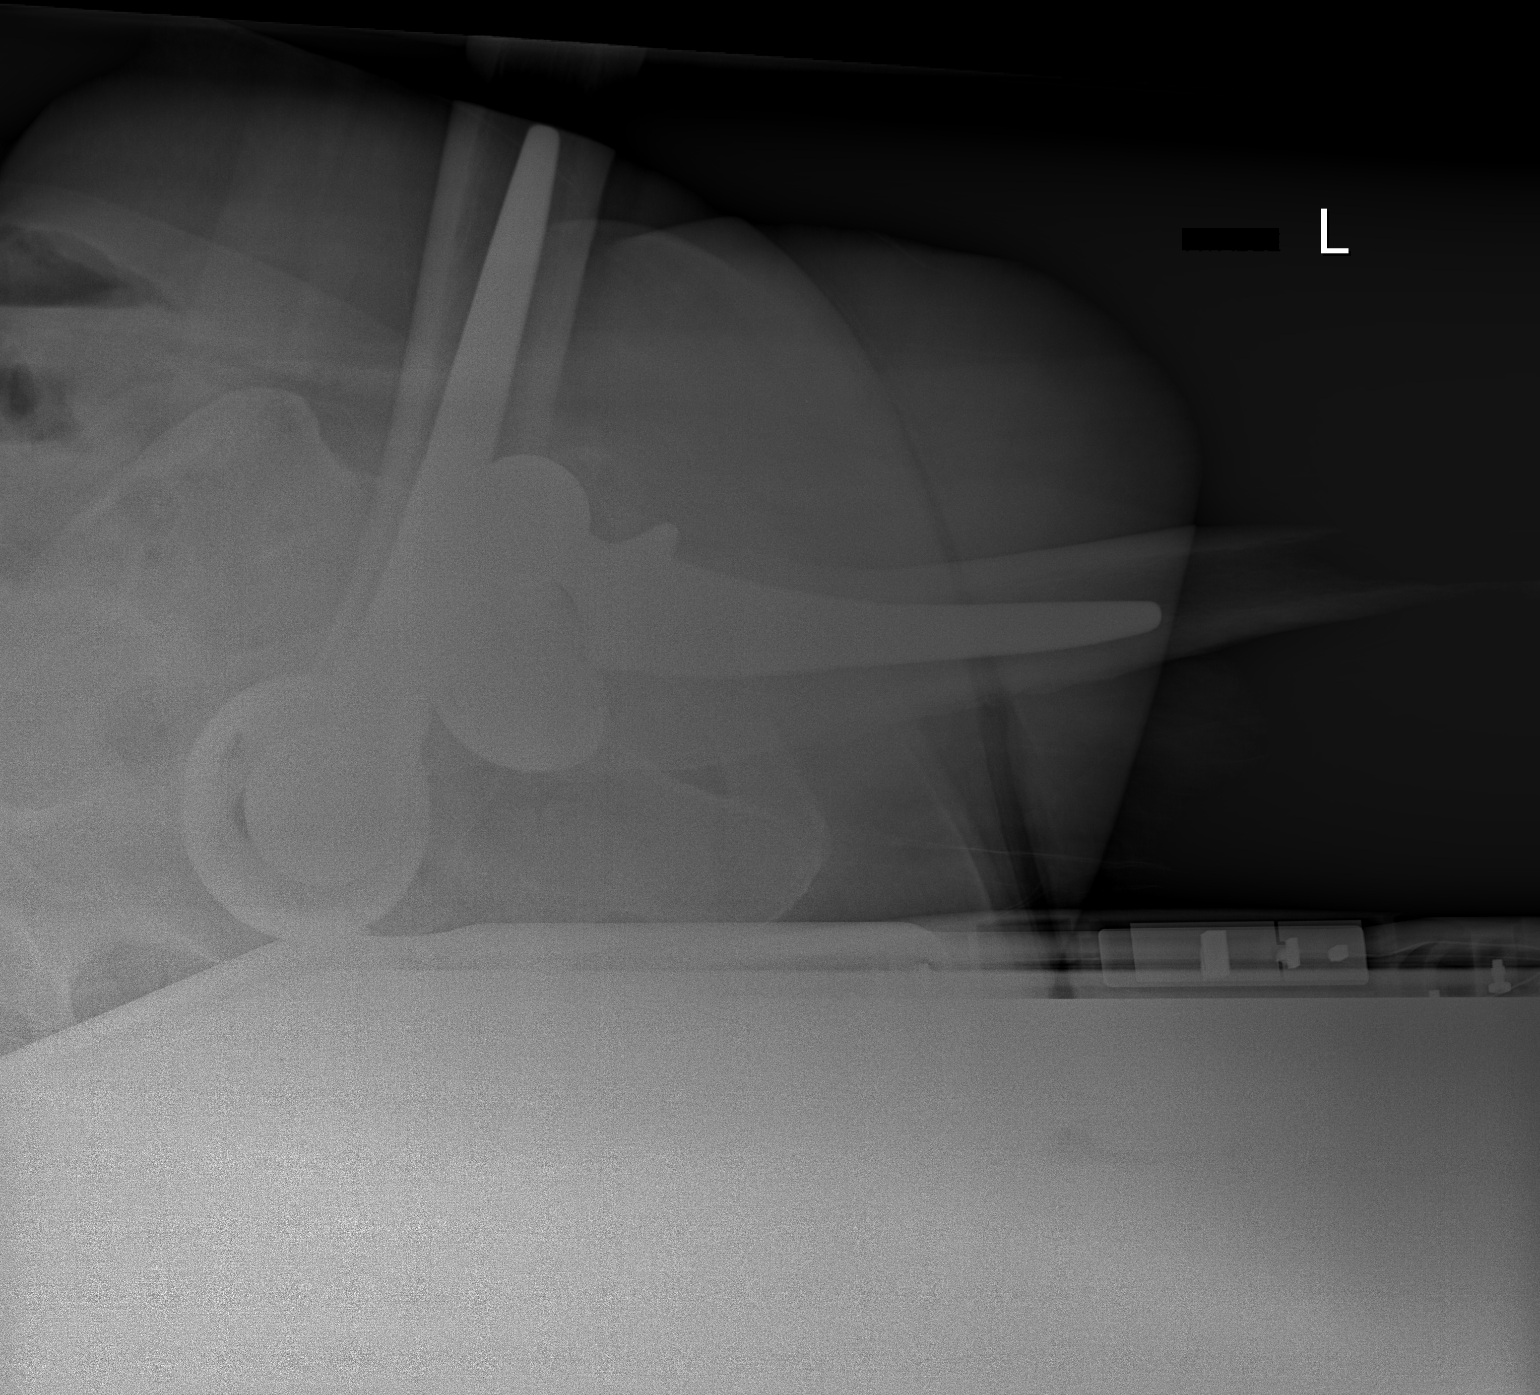

[3 of 3 positions shown; findings below may reference images not displayed]

FINDINGS: The left hip arthroplasty is dislocated. The femoral component has
dislocated anterior and slightly superior to the acetabular
component.

Right total hip arthroplasty is well aligned.

All prosthetic components appear well seated, without evidence of
loosening.

No fracture.

Soft tissues are unremarkable.
IMPRESSION: 1. Dislocated left hip arthroplasty.  No fracture.

## 2021-10-20 DIAGNOSIS — M1711 Unilateral primary osteoarthritis, right knee: Secondary | ICD-10-CM | POA: Diagnosis not present

## 2021-10-25 DIAGNOSIS — Z789 Other specified health status: Secondary | ICD-10-CM | POA: Diagnosis not present

## 2021-10-25 DIAGNOSIS — I10 Essential (primary) hypertension: Secondary | ICD-10-CM | POA: Diagnosis not present

## 2021-10-25 DIAGNOSIS — J309 Allergic rhinitis, unspecified: Secondary | ICD-10-CM | POA: Diagnosis not present

## 2021-10-25 DIAGNOSIS — Z299 Encounter for prophylactic measures, unspecified: Secondary | ICD-10-CM | POA: Diagnosis not present

## 2021-10-27 DIAGNOSIS — M1711 Unilateral primary osteoarthritis, right knee: Secondary | ICD-10-CM | POA: Diagnosis not present

## 2021-11-03 DIAGNOSIS — M1711 Unilateral primary osteoarthritis, right knee: Secondary | ICD-10-CM | POA: Diagnosis not present

## 2021-12-08 DIAGNOSIS — L820 Inflamed seborrheic keratosis: Secondary | ICD-10-CM | POA: Diagnosis not present

## 2021-12-08 DIAGNOSIS — D225 Melanocytic nevi of trunk: Secondary | ICD-10-CM | POA: Diagnosis not present

## 2021-12-08 DIAGNOSIS — Z1283 Encounter for screening for malignant neoplasm of skin: Secondary | ICD-10-CM | POA: Diagnosis not present

## 2021-12-08 DIAGNOSIS — X32XXXD Exposure to sunlight, subsequent encounter: Secondary | ICD-10-CM | POA: Diagnosis not present

## 2021-12-08 DIAGNOSIS — L570 Actinic keratosis: Secondary | ICD-10-CM | POA: Diagnosis not present

## 2021-12-08 DIAGNOSIS — L821 Other seborrheic keratosis: Secondary | ICD-10-CM | POA: Diagnosis not present

## 2021-12-30 DIAGNOSIS — M1711 Unilateral primary osteoarthritis, right knee: Secondary | ICD-10-CM | POA: Diagnosis not present

## 2022-01-10 DIAGNOSIS — Z1231 Encounter for screening mammogram for malignant neoplasm of breast: Secondary | ICD-10-CM | POA: Diagnosis not present

## 2022-01-26 DIAGNOSIS — J069 Acute upper respiratory infection, unspecified: Secondary | ICD-10-CM | POA: Diagnosis not present

## 2022-01-26 DIAGNOSIS — Z299 Encounter for prophylactic measures, unspecified: Secondary | ICD-10-CM | POA: Diagnosis not present

## 2022-01-26 DIAGNOSIS — I10 Essential (primary) hypertension: Secondary | ICD-10-CM | POA: Diagnosis not present

## 2022-01-26 DIAGNOSIS — Z87891 Personal history of nicotine dependence: Secondary | ICD-10-CM | POA: Diagnosis not present

## 2022-02-09 DIAGNOSIS — W548XXA Other contact with dog, initial encounter: Secondary | ICD-10-CM | POA: Diagnosis not present

## 2022-02-09 DIAGNOSIS — Z299 Encounter for prophylactic measures, unspecified: Secondary | ICD-10-CM | POA: Diagnosis not present

## 2022-02-09 DIAGNOSIS — I10 Essential (primary) hypertension: Secondary | ICD-10-CM | POA: Diagnosis not present

## 2022-02-22 DIAGNOSIS — G8918 Other acute postprocedural pain: Secondary | ICD-10-CM | POA: Diagnosis not present

## 2022-02-22 DIAGNOSIS — M948X6 Other specified disorders of cartilage, lower leg: Secondary | ICD-10-CM | POA: Diagnosis not present

## 2022-02-22 DIAGNOSIS — M23351 Other meniscus derangements, posterior horn of lateral meniscus, right knee: Secondary | ICD-10-CM | POA: Diagnosis not present

## 2022-02-22 DIAGNOSIS — M23251 Derangement of posterior horn of lateral meniscus due to old tear or injury, right knee: Secondary | ICD-10-CM | POA: Diagnosis not present

## 2022-03-14 DIAGNOSIS — H43812 Vitreous degeneration, left eye: Secondary | ICD-10-CM | POA: Diagnosis not present

## 2022-03-14 DIAGNOSIS — H524 Presbyopia: Secondary | ICD-10-CM | POA: Diagnosis not present

## 2022-03-21 DIAGNOSIS — Z789 Other specified health status: Secondary | ICD-10-CM | POA: Diagnosis not present

## 2022-03-21 DIAGNOSIS — Z299 Encounter for prophylactic measures, unspecified: Secondary | ICD-10-CM | POA: Diagnosis not present

## 2022-03-21 DIAGNOSIS — Z1211 Encounter for screening for malignant neoplasm of colon: Secondary | ICD-10-CM | POA: Diagnosis not present

## 2022-03-21 DIAGNOSIS — I10 Essential (primary) hypertension: Secondary | ICD-10-CM | POA: Diagnosis not present

## 2022-03-24 ENCOUNTER — Encounter: Payer: Self-pay | Admitting: *Deleted

## 2022-03-29 ENCOUNTER — Encounter (INDEPENDENT_AMBULATORY_CARE_PROVIDER_SITE_OTHER): Payer: Self-pay | Admitting: *Deleted

## 2022-04-20 ENCOUNTER — Other Ambulatory Visit (INDEPENDENT_AMBULATORY_CARE_PROVIDER_SITE_OTHER): Payer: Self-pay

## 2022-04-20 DIAGNOSIS — Z1211 Encounter for screening for malignant neoplasm of colon: Secondary | ICD-10-CM

## 2022-04-21 ENCOUNTER — Encounter (INDEPENDENT_AMBULATORY_CARE_PROVIDER_SITE_OTHER): Payer: Self-pay | Admitting: *Deleted

## 2022-05-03 ENCOUNTER — Telehealth (INDEPENDENT_AMBULATORY_CARE_PROVIDER_SITE_OTHER): Payer: Self-pay

## 2022-05-03 ENCOUNTER — Encounter (INDEPENDENT_AMBULATORY_CARE_PROVIDER_SITE_OTHER): Payer: Self-pay

## 2022-05-03 MED ORDER — PEG 3350-KCL-NA BICARB-NACL 420 G PO SOLR: 4000.0000 mL | ORAL | 0 refills | Status: DC

## 2022-05-03 NOTE — Telephone Encounter (Signed)
Referring MD/PCP: Dr Manuella Ghazi  Procedure: Tcs  Reason/Indication:  Screening   Has patient had this procedure before?  Yes   If so, when, by whom and where?  10 years ago  Is there a family history of colon cancer?  No   Who?  What age when diagnosed?    Is patient diabetic? If yes, Type 1 or Type 2   No       Does patient have prosthetic heart valve or mechanical valve?  No   Do you have a pacemaker/defibrillator?  No   Has patient ever had endocarditis/atrial fibrillation? No   Does patient use oxygen? No   Has patient had joint replacement within last 12 months?  No   Is patient constipated or do they take laxatives? No   Does patient have a history of alcohol/drug use?  No   Have you had a stroke/heart attack last 6 mths? No   Do you take medicine for weight loss?  No   For female patients,: have you had a hysterectomy yes                      are you post menopausal yes                      do you still have your menstrual cycle no   Is patient on blood thinner such as Coumadin, Plavix and/or Aspirin? No   Medications: Lisinopril 30 mg daily, Estradiol 0.5 mg daily, Xyzal 5 mg daily, Vit C 670 mg daily, Vit D3 50 mcg daily, Centrum for women 50 + daily  Allergies: PCN   Medication Adjustment per Dr Jenetta Downer none  Procedure date & time: 06/03/22 at 815

## 2022-05-03 NOTE — Telephone Encounter (Signed)
Ashtin Rosner Ann Stepehn Eckard, CMA  ?

## 2022-05-13 DIAGNOSIS — H43813 Vitreous degeneration, bilateral: Secondary | ICD-10-CM | POA: Diagnosis not present

## 2022-05-13 DIAGNOSIS — H33321 Round hole, right eye: Secondary | ICD-10-CM | POA: Diagnosis not present

## 2022-05-13 DIAGNOSIS — H35363 Drusen (degenerative) of macula, bilateral: Secondary | ICD-10-CM | POA: Diagnosis not present

## 2022-05-13 DIAGNOSIS — H35033 Hypertensive retinopathy, bilateral: Secondary | ICD-10-CM | POA: Diagnosis not present

## 2022-06-01 ENCOUNTER — Encounter (HOSPITAL_COMMUNITY): Payer: Self-pay

## 2022-06-01 ENCOUNTER — Encounter (HOSPITAL_COMMUNITY)
Admission: RE | Admit: 2022-06-01 | Discharge: 2022-06-01 | Disposition: A | Discharge status: Home / Self Care | Payer: Medicare Other | Source: Ambulatory Visit | Attending: Gastroenterology | Admitting: Gastroenterology

## 2022-06-03 ENCOUNTER — Ambulatory Visit (HOSPITAL_BASED_OUTPATIENT_CLINIC_OR_DEPARTMENT_OTHER): Payer: Medicare Other | Admitting: Anesthesiology

## 2022-06-03 ENCOUNTER — Encounter (HOSPITAL_COMMUNITY)
Admission: RE | Disposition: A | Discharge status: Home / Self Care | Payer: Self-pay | Source: Home / Self Care | Attending: Gastroenterology

## 2022-06-03 ENCOUNTER — Encounter (HOSPITAL_COMMUNITY): Payer: Self-pay | Admitting: Gastroenterology

## 2022-06-03 ENCOUNTER — Ambulatory Visit (HOSPITAL_COMMUNITY): Payer: Medicare Other | Admitting: Anesthesiology

## 2022-06-03 ENCOUNTER — Ambulatory Visit (HOSPITAL_COMMUNITY)
Admission: RE | Admit: 2022-06-03 | Discharge: 2022-06-03 | Disposition: A | Discharge status: Home / Self Care | Payer: Medicare Other | Attending: Gastroenterology | Admitting: Gastroenterology

## 2022-06-03 DIAGNOSIS — I10 Essential (primary) hypertension: Secondary | ICD-10-CM | POA: Diagnosis not present

## 2022-06-03 DIAGNOSIS — M1990 Unspecified osteoarthritis, unspecified site: Secondary | ICD-10-CM | POA: Diagnosis not present

## 2022-06-03 DIAGNOSIS — Z800 Family history of malignant neoplasm of digestive organs: Secondary | ICD-10-CM | POA: Diagnosis not present

## 2022-06-03 DIAGNOSIS — K5730 Diverticulosis of large intestine without perforation or abscess without bleeding: Secondary | ICD-10-CM | POA: Insufficient documentation

## 2022-06-03 DIAGNOSIS — Z1211 Encounter for screening for malignant neoplasm of colon: Secondary | ICD-10-CM

## 2022-06-03 DIAGNOSIS — Z87891 Personal history of nicotine dependence: Secondary | ICD-10-CM | POA: Insufficient documentation

## 2022-06-03 HISTORY — PX: COLONOSCOPY WITH PROPOFOL: SHX5780

## 2022-06-03 LAB — HM COLONOSCOPY

## 2022-06-03 SURGERY — COLONOSCOPY WITH PROPOFOL
Anesthesia: General

## 2022-06-03 MED ORDER — LIDOCAINE HCL 1 % IJ SOLN
INTRAMUSCULAR | Status: DC | PRN
  Administered 2022-06-03: 50 mg via INTRADERMAL

## 2022-06-03 MED ORDER — LACTATED RINGERS IV SOLN: INTRAVENOUS | Status: DC

## 2022-06-03 MED ORDER — PROPOFOL 10 MG/ML IV BOLUS
INTRAVENOUS | Status: DC | PRN
  Administered 2022-06-03: 100 mg via INTRAVENOUS
  Administered 2022-06-03: 20 mg via INTRAVENOUS

## 2022-06-03 MED ORDER — PROPOFOL 500 MG/50ML IV EMUL
INTRAVENOUS | Status: DC | PRN
  Administered 2022-06-03: 150 ug/kg/min via INTRAVENOUS

## 2022-06-03 NOTE — Discharge Instructions (Signed)
You are being discharged to home.  Resume your previous diet.  Your physician has recommended a repeat colonoscopy in five years for screening purposes as bowel prep was fair in the right side of the colon.

## 2022-06-03 NOTE — Addendum Note (Signed)
Addendum  created 06/03/22 1043 by Ollen Bowl, CRNA   Clinical Note Signed

## 2022-06-03 NOTE — Anesthesia Preprocedure Evaluation (Signed)
Anesthesia Evaluation  Patient identified by MRN, date of birth, ID band Patient awake    Reviewed: Allergy & Precautions, H&P , NPO status , Patient's Chart, lab work & pertinent test results  History of Anesthesia Complications (+) PONV and history of anesthetic complications  Airway Mallampati: II  TM Distance: >3 FB Neck ROM: Full    Dental  (+) Dental Advisory Given, Implants Crowns :   Pulmonary former smoker   Pulmonary exam normal breath sounds clear to auscultation       Cardiovascular hypertension, Pt. on medications Normal cardiovascular exam Rhythm:Regular Rate:Normal     Neuro/Psych negative neurological ROS  negative psych ROS   GI/Hepatic negative GI ROS, Neg liver ROS,,,  Endo/Other  negative endocrine ROS    Renal/GU negative Renal ROS  negative genitourinary   Musculoskeletal  (+) Arthritis , Osteoarthritis,    Abdominal   Peds negative pediatric ROS (+)  Hematology negative hematology ROS (+)   Anesthesia Other Findings   Reproductive/Obstetrics negative OB ROS                             Anesthesia Physical Anesthesia Plan  ASA: 2  Anesthesia Plan: General   Post-op Pain Management: Minimal or no pain anticipated   Induction: Intravenous  PONV Risk Score and Plan: Propofol infusion  Airway Management Planned: Nasal Cannula and Natural Airway  Additional Equipment:   Intra-op Plan:   Post-operative Plan:   Informed Consent: I have reviewed the patients History and Physical, chart, labs and discussed the procedure including the risks, benefits and alternatives for the proposed anesthesia with the patient or authorized representative who has indicated his/her understanding and acceptance.     Dental advisory given  Plan Discussed with: CRNA and Surgeon  Anesthesia Plan Comments:         Anesthesia Quick Evaluation

## 2022-06-03 NOTE — Anesthesia Postprocedure Evaluation (Signed)
Anesthesia Post Note  Patient: Denise Steele  Procedure(s) Performed: COLONOSCOPY WITH PROPOFOL  Patient location during evaluation: Short Stay Anesthesia Type: General Pain management: pain level controlled Vital Signs Assessment: post-procedure vital signs reviewed and stable Respiratory status: spontaneous breathing Postop Assessment: no apparent nausea or vomiting Anesthetic complications: no   No notable events documented.   Last Vitals:  Vitals:   06/03/22 0732 06/03/22 0849  BP: 134/75 (!) 104/56  Pulse: 71 69  Resp: 16 (!) 22  Temp: 36.6 C 36.4 C  SpO2: 100% 98%    Last Pain:  Vitals:   06/03/22 0849  TempSrc: Oral  PainSc: 0-No pain                 Cal Gindlesperger

## 2022-06-03 NOTE — Transfer of Care (Addendum)
Immediate Anesthesia Transfer of Care Note  Patient: Denise Steele  Procedure(s) Performed: COLONOSCOPY WITH PROPOFOL  Patient Location: Short Stay  Anesthesia Type:General  Level of Consciousness: awake  Airway & Oxygen Therapy: Patient Spontanous Breathing  Post-op Assessment: Report given to RN  Post vital signs: Reviewed  Last Vitals:  Vitals Value Taken Time  BP 104/56   Temp 36.4   Pulse 69   Resp 22   SpO2 98     Last Pain:  Vitals:   06/03/22 0823  TempSrc:   PainSc: 0-No pain         Complications: No notable events documented.

## 2022-06-03 NOTE — Op Note (Signed)
[facility] Patient Name: Denise Steele Procedure Date: 06/03/2022 8:17 AM MRN: [id number] Date of Birth: May 29, 1956 Attending MD: Maylon Peppers , , [id number] CSN: [id number] Age: 66 Admit Type: Outpatient Procedure:                Colonoscopy Indications:              Screening for colorectal malignant neoplasm Providers:                Maylon Peppers, Caprice Kluver, Raphael Gibney,                            Technician Referring MD:              Medicines:                Monitored Anesthesia Care Complications:            No immediate complications. Estimated Blood Loss:     Estimated blood loss: none. Procedure:                Pre-Anesthesia Assessment:                           - Prior to the procedure, a History and Physical                            was performed, and patient medications, allergies                            and sensitivities were reviewed. The patient's                            tolerance of previous anesthesia was reviewed.                           - The risks and benefits of the procedure and the                            sedation options and risks were discussed with the                            patient. All questions were answered and informed                            consent was obtained.                           - ASA Grade Assessment: II - A patient with mild                            systemic disease.                           After obtaining informed consent, the colonoscope                            was passed under direct vision. Throughout the  procedure, the patient's blood pressure, pulse, and                            oxygen saturations were monitored continuously. The                            [id number]-HQ190L ([id number]) scope was introduced through                            the anus and advanced to the the cecum, identified                            by appendiceal orifice and ileocecal valve. The                             colonoscopy was performed without difficulty. The                            patient tolerated the procedure well. The quality                            of the bowel preparation was adequate to identify                            polyps greater than 5 mm in size. Scope In: 8:27:30 AM Scope Out: 8:44:53 AM Scope Withdrawal Time: 0 hours 14 minutes 28 seconds  Total Procedure Duration: 0 hours 17 minutes 23 seconds  Findings:      The perianal and digital rectal examinations were normal.      A few small-mouthed diverticula were found in the sigmoid colon.      The retroflexed view of the distal rectum and anal verge was normal and       showed no anal or rectal abnormalities. Impression:               - Diverticulosis in the sigmoid colon.                           - The distal rectum and anal verge are normal on                            retroflexion view.                           - No specimens collected. Moderate Sedation:      Per Anesthesia Care Recommendation:           - Discharge patient to home (ambulatory).                           - Resume previous diet.                           - Repeat colonoscopy in 5 years for screening  purposes as bowel prep was fair in the right side                            of the colon. Procedure Code(s):        --- Professional ---                           Q7591, Colorectal cancer screening; colonoscopy on                            individual not meeting criteria for high risk Diagnosis Code(s):        --- Professional ---                           Z12.11, Encounter for screening for malignant                            neoplasm of colon                           K57.30, Diverticulosis of large intestine without                            perforation or abscess without bleeding CPT copyright 2022 [employer] Association. All rights reserved. The codes documented in this report are  preliminary and upon coder review may  be revised to meet current compliance requirements. Maylon Peppers, MD Maylon Peppers,  06/03/2022 8:48:22 AM This report has been signed electronically. Number of Addenda: 0

## 2022-06-03 NOTE — H&P (Signed)
Denise Steele is an 66 y.o. female.   Chief Complaint: Colorectal cancer screening HPI: 66 year old female with past medical history of hypertension, arthritis, coming for screening colonoscopy.  Last colonoscopy was 10 years ago, patient reports it was normal.  The patient denies having any complaints such as melena, hematochezia, abdominal pain or distention, change in her bowel movement consistency or frequency, no changes in weight recently.  Grandmother has history of colon cancer.   Past Medical History:  Diagnosis Date   Arthritis    hips and knees   History of kidney stones    Hypertension    Medical history non-contributory    PONV (postoperative nausea and vomiting)     Past Surgical History:  Procedure Laterality Date   ANKLE FRACTURE SURGERY Right    CARPAL TUNNEL RELEASE Right    CYSTOSCOPY N/A 05/30/2013   Procedure: CYSTOSCOPY;  Surgeon: Logan Bores, MD;  Location: [facility];  Service: Gynecology;  Laterality: N/A;   ENDOMETRIAL ABLATION     FRACTURE SURGERY Right 2011   leg   kidney stone removal     KNEE ARTHROSCOPY Right    LAPAROSCOPIC ASSISTED VAGINAL HYSTERECTOMY N/A 05/30/2013   Procedure: LAPAROSCOPIC ASSISTED VAGINAL HYSTERECTOMY;  Surgeon: Logan Bores, MD;  Location: [facility];  Service: Gynecology;  Laterality: N/A;  abdomen and vagins   SALPINGOOPHORECTOMY N/A 05/30/2013   Procedure: SALPINGO OOPHORECTOMY;  Surgeon: Logan Bores, MD;  Location: [facility];  Service: Gynecology;  Laterality: N/A;   TONSILLECTOMY     TOTAL HIP ARTHROPLASTY Right 11/13/2019   Procedure: TOTAL HIP ARTHROPLASTY ANTERIOR APPROACH;  Surgeon: Gaynelle Arabian, MD;  Location: [facility];  Service: Orthopedics;  Laterality: Right;  117mn   TOTAL HIP ARTHROPLASTY Left 10/21/2020   Procedure: TOTAL HIP ARTHROPLASTY ANTERIOR APPROACH;  Surgeon: AGaynelle Arabian MD;  Location: [facility];  Service: Orthopedics;  Laterality: Left;  1033m    History reviewed. No pertinent family  history. Social History:  reports that she quit smoking about 38 years ago. Her smoking use included cigarettes. She has a 3.75 pack-year smoking history. She has never used smokeless tobacco. She reports current alcohol use. She reports that she does not use drugs.  Allergies:  Allergies  Allergen Reactions   Penicillins Hives    Did it involve swelling of the face/tongue/throat, SOB, or low BP? Unknown Did it involve sudden or severe rash/hives, skin peeling, or any reaction on the inside of your mouth or nose? Yes Did you need to seek medical attention at a hospital or doctor's office? No When did it last happen? Childhood reaction. If all above answers are "NO", may proceed with cephalosporin use.  Tolerated Cephalosporin Date: 11/13/19.      Medications Prior to Admission  Medication Sig Dispense Refill   albuterol (VENTOLIN HFA) 108 (90 Base) MCG/ACT inhaler Inhale 1-2 puffs into the lungs every 6 (six) hours as needed (sports induced asthma).     Biotin 5 MG CAPS Take 5 mg by mouth in the morning.     Cholecalciferol (VITAMIN D3) 50 MCG (2000 UT) TABS Take 2,000 Units by mouth in the morning.     estradiol (ESTRACE) 0.5 MG tablet Take 0.5 mg by mouth in the morning.     levocetirizine (XYZAL) 5 MG tablet Take 5 mg by mouth every evening.     lisinopril (ZESTRIL) 30 MG tablet Take 30 mg by mouth in the morning.     Multiple Vitamin (MULTIVITAMIN WITH MINERALS) TABS tablet Take 1 tablet  by mouth in the morning. Centrum Silver for Women     polyethylene glycol-electrolytes (TRILYTE) 420 g solution Take 4,000 mLs by mouth as directed. 4000 mL 0   Propylene Glycol (SYSTANE COMPLETE) 0.6 % SOLN Apply 1 drop to eye daily.     traZODone (DESYREL) 50 MG tablet Take 50 mg by mouth at bedtime as needed for sleep.     vitamin E 180 MG (400 UNITS) capsule Take 400 Units by mouth in the morning.      No results found for this or any previous visit (from the past 48 hour(s)). No results  found.  Review of Systems  All other systems reviewed and are negative.   Blood pressure 134/75, pulse 71, temperature 97.9 F (36.6 C), temperature source Oral, resp. rate 16, height '5\' 5"'$  (1.651 m), weight 86.2 kg, SpO2 100 %. Physical Exam  GENERAL: The patient is AO x3, in no acute distress. HEENT: Head is normocephalic and atraumatic. EOMI are intact. Mouth is well hydrated and without lesions. NECK: Supple. No masses LUNGS: Clear to auscultation. No presence of rhonchi/wheezing/rales. Adequate chest expansion HEART: RRR, normal s1 and s2. ABDOMEN: Soft, nontender, no guarding, no peritoneal signs, and nondistended. BS +. No masses. EXTREMITIES: Without any cyanosis, clubbing, rash, lesions or edema. NEUROLOGIC: AOx3, no focal motor deficit. SKIN: no jaundice, no rashes  Assessment/Plan  66 year old female with past medical history of hypertension, arthritis, coming for screening colonoscopy. The patient is at average risk for colorectal cancer.  We will proceed with colonoscopy today.   Harvel Quale, MD 06/03/2022, 8:22 AM

## 2022-06-07 ENCOUNTER — Encounter (INDEPENDENT_AMBULATORY_CARE_PROVIDER_SITE_OTHER): Payer: Self-pay | Admitting: *Deleted

## 2022-06-09 ENCOUNTER — Encounter (HOSPITAL_COMMUNITY): Payer: Self-pay | Admitting: Gastroenterology

## 2022-07-06 DIAGNOSIS — I10 Essential (primary) hypertension: Secondary | ICD-10-CM | POA: Diagnosis not present

## 2022-07-06 DIAGNOSIS — U071 COVID-19: Secondary | ICD-10-CM | POA: Diagnosis not present

## 2022-07-06 DIAGNOSIS — Z299 Encounter for prophylactic measures, unspecified: Secondary | ICD-10-CM | POA: Diagnosis not present

## 2022-08-11 DIAGNOSIS — Z299 Encounter for prophylactic measures, unspecified: Secondary | ICD-10-CM | POA: Diagnosis not present

## 2022-08-11 DIAGNOSIS — R5383 Other fatigue: Secondary | ICD-10-CM | POA: Diagnosis not present

## 2022-08-11 DIAGNOSIS — Z7189 Other specified counseling: Secondary | ICD-10-CM | POA: Diagnosis not present

## 2022-08-11 DIAGNOSIS — Z0000 Encounter for general adult medical examination without abnormal findings: Secondary | ICD-10-CM | POA: Diagnosis not present

## 2022-08-11 DIAGNOSIS — I10 Essential (primary) hypertension: Secondary | ICD-10-CM | POA: Diagnosis not present

## 2022-08-11 DIAGNOSIS — E7800 Pure hypercholesterolemia, unspecified: Secondary | ICD-10-CM | POA: Diagnosis not present

## 2022-08-11 DIAGNOSIS — G4700 Insomnia, unspecified: Secondary | ICD-10-CM | POA: Diagnosis not present

## 2023-01-16 DIAGNOSIS — T8484XA Pain due to internal orthopedic prosthetic devices, implants and grafts, initial encounter: Secondary | ICD-10-CM | POA: Diagnosis not present

## 2023-01-16 DIAGNOSIS — M25571 Pain in right ankle and joints of right foot: Secondary | ICD-10-CM | POA: Diagnosis not present

## 2023-01-16 DIAGNOSIS — M19171 Post-traumatic osteoarthritis, right ankle and foot: Secondary | ICD-10-CM | POA: Diagnosis not present

## 2023-01-18 DIAGNOSIS — Z1231 Encounter for screening mammogram for malignant neoplasm of breast: Secondary | ICD-10-CM | POA: Diagnosis not present

## 2023-02-17 DIAGNOSIS — M25561 Pain in right knee: Secondary | ICD-10-CM | POA: Diagnosis not present

## 2023-03-16 DIAGNOSIS — H43811 Vitreous degeneration, right eye: Secondary | ICD-10-CM | POA: Diagnosis not present

## 2023-03-16 DIAGNOSIS — H524 Presbyopia: Secondary | ICD-10-CM | POA: Diagnosis not present

## 2023-03-30 DIAGNOSIS — M1711 Unilateral primary osteoarthritis, right knee: Secondary | ICD-10-CM | POA: Diagnosis not present

## 2023-03-30 DIAGNOSIS — M7062 Trochanteric bursitis, left hip: Secondary | ICD-10-CM | POA: Diagnosis not present

## 2023-08-14 DIAGNOSIS — Z299 Encounter for prophylactic measures, unspecified: Secondary | ICD-10-CM | POA: Diagnosis not present

## 2023-08-14 DIAGNOSIS — I10 Essential (primary) hypertension: Secondary | ICD-10-CM | POA: Diagnosis not present

## 2023-08-14 DIAGNOSIS — Z713 Dietary counseling and surveillance: Secondary | ICD-10-CM | POA: Diagnosis not present

## 2023-08-14 DIAGNOSIS — R5383 Other fatigue: Secondary | ICD-10-CM | POA: Diagnosis not present

## 2023-08-14 DIAGNOSIS — Z7189 Other specified counseling: Secondary | ICD-10-CM | POA: Diagnosis not present

## 2023-08-14 DIAGNOSIS — E7800 Pure hypercholesterolemia, unspecified: Secondary | ICD-10-CM | POA: Diagnosis not present

## 2023-08-14 DIAGNOSIS — Z79899 Other long term (current) drug therapy: Secondary | ICD-10-CM | POA: Diagnosis not present

## 2023-08-14 DIAGNOSIS — Z0000 Encounter for general adult medical examination without abnormal findings: Secondary | ICD-10-CM | POA: Diagnosis not present

## 2023-11-06 DIAGNOSIS — M1711 Unilateral primary osteoarthritis, right knee: Secondary | ICD-10-CM | POA: Diagnosis not present

## 2023-11-06 DIAGNOSIS — Z96642 Presence of left artificial hip joint: Secondary | ICD-10-CM | POA: Diagnosis not present

## 2023-11-09 DIAGNOSIS — L718 Other rosacea: Secondary | ICD-10-CM | POA: Diagnosis not present

## 2023-11-09 DIAGNOSIS — Z1283 Encounter for screening for malignant neoplasm of skin: Secondary | ICD-10-CM | POA: Diagnosis not present

## 2023-11-09 DIAGNOSIS — D225 Melanocytic nevi of trunk: Secondary | ICD-10-CM | POA: Diagnosis not present

## 2024-02-21 DIAGNOSIS — Z1231 Encounter for screening mammogram for malignant neoplasm of breast: Secondary | ICD-10-CM | POA: Diagnosis not present

## 2024-02-28 DIAGNOSIS — I10 Essential (primary) hypertension: Secondary | ICD-10-CM | POA: Diagnosis not present

## 2024-02-28 DIAGNOSIS — Z299 Encounter for prophylactic measures, unspecified: Secondary | ICD-10-CM | POA: Diagnosis not present

## 2024-02-28 DIAGNOSIS — E7800 Pure hypercholesterolemia, unspecified: Secondary | ICD-10-CM | POA: Diagnosis not present

## 2024-02-28 DIAGNOSIS — R52 Pain, unspecified: Secondary | ICD-10-CM | POA: Diagnosis not present

## 2024-03-01 DIAGNOSIS — Z79899 Other long term (current) drug therapy: Secondary | ICD-10-CM | POA: Diagnosis not present

## 2024-03-18 DIAGNOSIS — H5212 Myopia, left eye: Secondary | ICD-10-CM | POA: Diagnosis not present

## 2024-03-18 DIAGNOSIS — H43812 Vitreous degeneration, left eye: Secondary | ICD-10-CM | POA: Diagnosis not present

## 2024-04-03 DIAGNOSIS — Z299 Encounter for prophylactic measures, unspecified: Secondary | ICD-10-CM | POA: Diagnosis not present

## 2024-04-03 DIAGNOSIS — E7800 Pure hypercholesterolemia, unspecified: Secondary | ICD-10-CM | POA: Diagnosis not present

## 2024-04-03 DIAGNOSIS — Z789 Other specified health status: Secondary | ICD-10-CM | POA: Diagnosis not present

## 2024-04-03 DIAGNOSIS — I10 Essential (primary) hypertension: Secondary | ICD-10-CM | POA: Diagnosis not present

## 2024-04-06 DIAGNOSIS — I10 Essential (primary) hypertension: Secondary | ICD-10-CM | POA: Diagnosis not present

## 2024-04-22 DIAGNOSIS — L578 Other skin changes due to chronic exposure to nonionizing radiation: Secondary | ICD-10-CM | POA: Diagnosis not present

## 2024-04-22 DIAGNOSIS — L821 Other seborrheic keratosis: Secondary | ICD-10-CM | POA: Diagnosis not present

## 2024-04-22 DIAGNOSIS — L820 Inflamed seborrheic keratosis: Secondary | ICD-10-CM | POA: Diagnosis not present

## 2024-04-22 DIAGNOSIS — L814 Other melanin hyperpigmentation: Secondary | ICD-10-CM | POA: Diagnosis not present

## 2024-05-04 DIAGNOSIS — Z713 Dietary counseling and surveillance: Secondary | ICD-10-CM | POA: Diagnosis not present

## 2024-05-15 ENCOUNTER — Encounter (INDEPENDENT_AMBULATORY_CARE_PROVIDER_SITE_OTHER): Payer: Self-pay | Admitting: Gastroenterology

## 2024-08-08 NOTE — Patient Instructions (Addendum)
 SURGICAL WAITING ROOM VISITATION Patients having surgery or a procedure may have no more than 2 support people in the waiting area - these visitors may rotate.    Children under the age of 82 will not be allowed to visit due to the increase in respiratory illness  Children under the age of 55 must have an adult with them who is not the patient.  If the patient needs to stay at the hospital during part of their recovery, the visitor guidelines for inpatient rooms apply. Pre-op nurse will coordinate an appropriate time for 1 support person to accompany patient in pre-op.  This support person may not rotate.    Please refer to the [facility] website for the visitor guidelines for Inpatients (after your surgery is over and you are in a regular room).       Your procedure is scheduled on: 08-26-24   Report to [facility] Main Entrance    Report to admitting at 9:00 AM   Call this number if you have problems the morning of surgery [phone]   Do not eat food :After Midnight.   After Midnight you may have the following liquids until 8:30 AM DAY OF SURGERY  Water  Non-Citrus Juices (without pulp, NO RED-Apple, White grape, White cranberry) Black Coffee (NO MILK/CREAM OR CREAMERS, sugar ok)  Clear Tea (NO MILK/CREAM OR CREAMERS, sugar ok) regular and decaf                             Plain Jell-O (NO RED)                                           Fruit ices (not with fruit pulp, NO RED)                                     Popsicles (NO RED)                                                               Sports drinks like Gatorade (NO RED)                   The day of surgery:  Drink ONE (1) Pre-Surgery Clear Ensure by 8:30 AM the morning of surgery. Drink in one sitting. Do not sip.  This drink was given to you during your hospital  pre-op appointment visit. Nothing else to drink after completing the Pre-Surgery Clear Ensure.          If you have questions, please contact  your surgeons office.   FOLLOW  ANY ADDITIONAL PRE OP INSTRUCTIONS YOU RECEIVED FROM YOUR SURGEON'S OFFICE!!!     Oral Hygiene is also important to reduce your risk of infection.                                    Remember - BRUSH YOUR TEETH THE MORNING OF SURGERY WITH YOUR REGULAR TOOTHPASTE   Do NOT smoke after Midnight  Take these medicines the morning of surgery with A SIP OF WATER :    Okay to use inhalers and eyedrops   Do not take Lisinopril the morning of surgery  Stop all vitamins and herbal supplements 7 days before surgery  Bring CPAP mask and tubing day of surgery.                              You may not have any metal on your body including hair pins, jewelry, and body piercing             Do not wear make-up, lotions, powders, perfumes or deodorant  Do not wear nail polish including gel and S&S, artificial/acrylic nails, or any other type of covering on natural nails including finger and toenails. If you have artificial nails, gel coating, etc. that needs to be removed by a nail salon please have this removed prior to surgery or surgery may need to be canceled/ delayed if the surgeon/ anesthesia feels like they are unable to be safely monitored.   Do not shave  48 hours prior to surgery.           Do not bring valuables to the hospital. [facility] IS NOT RESPONSIBLE   FOR VALUABLES.   Contacts, dentures or bridgework may not be worn into surgery.   Bring small overnight bag day of surgery.   DO NOT BRING YOUR HOME MEDICATIONS TO THE HOSPITAL. PHARMACY WILL DISPENSE MEDICATIONS LISTED ON YOUR MEDICATION LIST TO YOU DURING YOUR ADMISSION IN THE HOSPITAL!     Special Instructions: Bring a copy of your healthcare power of attorney and living will documents the day of surgery if you haven't scanned them before.              Please read over the following fact sheets you were given: IF YOU HAVE QUESTIONS ABOUT YOUR PRE-OP INSTRUCTIONS PLEASE CALL [phone]  Gwen  If you received a COVID test during your pre-op visit  it is requested that you wear a mask when out in public, stay away from anyone that may not be feeling well and notify your surgeon if you develop symptoms. If you test positive for Covid or have been in contact with anyone that has tested positive in the last 10 days please notify you surgeon.   Pre-operative 4 CHG Bath Instructions  DYNA-Hex 4 Chlorhexidine  Gluconate 4% Solution Antiseptic 4 fl. oz   You can play a key role in reducing the risk of infection after surgery. Your skin needs to be as free of germs as possible. You can reduce the number of germs on your skin by washing with CHG (chlorhexidine  gluconate) soap before surgery. CHG is an antiseptic soap that kills germs and continues to kill germs even after washing.   DO NOT use if you have an allergy to chlorhexidine /CHG or antibacterial soaps. If your skin becomes reddened or irritated, stop using the CHG and notify one of our RNs at   Please shower with the CHG soap starting 4 days before surgery using the following schedule:     Please keep in mind the following:  DO NOT shave, including legs and underarms, starting the day of your first shower.   You may shave your face at any point before/day of surgery.  Place clean sheets on your bed the day you start using CHG soap. Use a clean washcloth (not used since being washed) for each shower.  DO NOT sleep with pets once you start using the CHG.  CHG Shower Instructions:  If you choose to wash your hair and private area, wash first with your normal shampoo/soap.  After you use shampoo/soap, rinse your hair and body thoroughly to remove shampoo/soap residue.  Turn the water  OFF and apply about 3 tablespoons (45 ml) of CHG soap to a CLEAN washcloth.  Apply CHG soap ONLY FROM YOUR NECK DOWN TO YOUR TOES (washing for 3-5 minutes)  DO NOT use CHG soap on face, private areas, open wounds, or sores.  Pay special attention to  the area where your surgery is being performed.  If you are having back surgery, having someone wash your back for you may be helpful. Wait 2 minutes after CHG soap is applied, then you may rinse off the CHG soap.  Pat dry with a clean towel  Put on clean clothes/pajamas   If you choose to wear lotion, please use ONLY the CHG-compatible lotions on the back of this paper.     Additional instructions for the day of surgery:  Shower with regular soap the day of surgery DO NOT APPLY any lotions, deodorants, cologne, or perfumes.   Put on clean/comfortable clothes.  Brush your teeth.  Ask your nurse before applying any prescription medications to the skin.   CHG Compatible Lotions   Aveeno Moisturizing lotion  Cetaphil Moisturizing Cream  Cetaphil Moisturizing Lotion  Clairol Herbal Essence Moisturizing Lotion, Dry Skin  Clairol Herbal Essence Moisturizing Lotion, Extra Dry Skin  Clairol Herbal Essence Moisturizing Lotion, Normal Skin  Curel Age Defying Therapeutic Moisturizing Lotion with Alpha Hydroxy  Curel Extreme Care Body Lotion  Curel Soothing Hands Moisturizing Hand Lotion  Curel Therapeutic Moisturizing Cream, Fragrance-Free  Curel Therapeutic Moisturizing Lotion, Fragrance-Free  Curel Therapeutic Moisturizing Lotion, Original Formula  Eucerin Daily Replenishing Lotion  Eucerin Dry Skin Therapy Plus Alpha Hydroxy Crme  Eucerin Dry Skin Therapy Plus Alpha Hydroxy Lotion  Eucerin Original Crme  Eucerin Original Lotion  Eucerin Plus Crme Eucerin Plus Lotion  Eucerin TriLipid Replenishing Lotion  Keri Anti-Bacterial Hand Lotion  Keri Deep Conditioning Original Lotion Dry Skin Formula Softly Scented  Keri Deep Conditioning Original Lotion, Fragrance Free Sensitive Skin Formula  Keri Lotion Fast Absorbing Fragrance Free Sensitive Skin Formula  Keri Lotion Fast Absorbing Softly Scented Dry Skin Formula  Keri Original Lotion  Keri Skin Renewal Lotion Keri Silky Smooth  Lotion  Keri Silky Smooth Sensitive Skin Lotion  Nivea Body Creamy Conditioning Oil  Nivea Body Extra Enriched Lotion  Nivea Body Original Lotion  Nivea Body Sheer Moisturizing Lotion Nivea Crme  Nivea Skin Firming Lotion  NutraDerm 30 Skin Lotion  NutraDerm Skin Lotion  NutraDerm Therapeutic Skin Cream  NutraDerm Therapeutic Skin Lotion  ProShield Protective Hand Cream  Provon moisturizing lotion   PATIENT SIGNATURE_________________________________  NURSE SIGNATURE__________________________________  ________________________________________________________________________    Nasario Exon  An incentive spirometer is a tool that can help keep your lungs clear and active. This tool measures how well you are filling your lungs with each breath. Taking long deep breaths may help reverse or decrease the chance of developing breathing (pulmonary) problems (especially infection) following: A long period of time when you are unable to move or be active. BEFORE THE PROCEDURE  If the spirometer includes an indicator to show your best effort, your nurse or respiratory therapist will set it to a desired goal. If possible, sit up straight or lean slightly forward. Try not to slouch. Hold the incentive spirometer  in an upright position. INSTRUCTIONS FOR USE  Sit on the edge of your bed if possible, or sit up as far as you can in bed or on a chair. Hold the incentive spirometer in an upright position. Breathe out normally. Place the mouthpiece in your mouth and seal your lips tightly around it. Breathe in slowly and as deeply as possible, raising the piston or the ball toward the top of the column. Hold your breath for 3-5 seconds or for as long as possible. Allow the piston or ball to fall to the bottom of the column. Remove the mouthpiece from your mouth and breathe out normally. Rest for a few seconds and repeat Steps 1 through 7 at least 10 times every 1-2 hours when you are awake.  Take your time and take a few normal breaths between deep breaths. The spirometer may include an indicator to show your best effort. Use the indicator as a goal to work toward during each repetition. After each set of 10 deep breaths, practice coughing to be sure your lungs are clear. If you have an incision (the cut made at the time of surgery), support your incision when coughing by placing a pillow or rolled up towels firmly against it. Once you are able to get out of bed, walk around indoors and cough well. You may stop using the incentive spirometer when instructed by your caregiver.  RISKS AND COMPLICATIONS Take your time so you do not get dizzy or light-headed. If you are in pain, you may need to take or ask for pain medication before doing incentive spirometry. It is harder to take a deep breath if you are having pain. AFTER USE Rest and breathe slowly and easily. It can be helpful to keep track of a log of your progress. Your caregiver can provide you with a simple table to help with this. If you are using the spirometer at home, follow these instructions: SEEK MEDICAL CARE IF:  You are having difficultly using the spirometer. You have trouble using the spirometer as often as instructed. Your pain medication is not giving enough relief while using the spirometer. You develop fever of 100.5 F (38.1 C) or higher. SEEK IMMEDIATE MEDICAL CARE IF:  You cough up bloody sputum that had not been present before. You develop fever of 102 F (38.9 C) or greater. You develop worsening pain at or near the incision site. MAKE SURE YOU:  Understand these instructions. Will watch your condition. Will get help right away if you are not doing well or get worse. Document Released: 11/28/2006 Document Revised: 10/10/2011 Document Reviewed: 01/29/2007 Exit[employer] Patient Information 2014 [employer], [place].   ________________________________________________________________________ WHAT IS A BLOOD  TRANSFUSION? Blood Transfusion Information  A transfusion is the replacement of blood or some of its parts. Blood is made up of multiple cells which provide different functions. Red blood cells carry oxygen  and are used for blood loss replacement. White blood cells fight against infection. Platelets control bleeding. Plasma helps clot blood. Other blood products are available for specialized needs, such as hemophilia or other clotting disorders. BEFORE THE TRANSFUSION  Who gives blood for transfusions?  Healthy volunteers who are fully evaluated to make sure their blood is safe. This is blood bank blood. Transfusion therapy is the safest it has ever been in the practice of medicine. Before blood is taken from a donor, a complete history is taken to make sure that person has no history of diseases nor engages in risky social behavior (examples  are intravenous drug use or sexual activity with multiple partners). The donor's travel history is screened to minimize risk of transmitting infections, such as malaria. The donated blood is tested for signs of infectious diseases, such as HIV and hepatitis. The blood is then tested to be sure it is compatible with you in order to minimize the chance of a transfusion reaction. If you or a relative donates blood, this is often done in anticipation of surgery and is not appropriate for emergency situations. It takes many days to process the donated blood. RISKS AND COMPLICATIONS Although transfusion therapy is very safe and saves many lives, the main dangers of transfusion include:  Getting an infectious disease. Developing a transfusion reaction. This is an allergic reaction to something in the blood you were given. Every precaution is taken to prevent this. The decision to have a blood transfusion has been considered carefully by your caregiver before blood is given. Blood is not given unless the benefits outweigh the risks. AFTER THE TRANSFUSION Right after  receiving a blood transfusion, you will usually feel much better and more energetic. This is especially true if your red blood cells have gotten low (anemic). The transfusion raises the level of the red blood cells which carry oxygen , and this usually causes an energy increase. The nurse administering the transfusion will monitor you carefully for complications. HOME CARE INSTRUCTIONS  No special instructions are needed after a transfusion. You may find your energy is better. Speak with your caregiver about any limitations on activity for underlying diseases you may have. SEEK MEDICAL CARE IF:  Your condition is not improving after your transfusion. You develop redness or irritation at the intravenous (IV) site. SEEK IMMEDIATE MEDICAL CARE IF:  Any of the following symptoms occur over the next 12 hours: Shaking chills. You have a temperature by mouth above 102 F (38.9 C), not controlled by medicine. Chest, back, or muscle pain. People around you feel you are not acting correctly or are confused. Shortness of breath or difficulty breathing. Dizziness and fainting. You get a rash or develop hives. You have a decrease in urine output. Your urine turns a dark color or changes to pink, red, or brown. Any of the following symptoms occur over the next 10 days: You have a temperature by mouth above 102 F (38.9 C), not controlled by medicine. Shortness of breath. Weakness after normal activity. The white part of the eye turns yellow (jaundice). You have a decrease in the amount of urine or are urinating less often. Your urine turns a dark color or changes to pink, red, or brown. Document Released: 07/15/2000 Document Revised: 10/10/2011 Document Reviewed: 03/03/2008 [facility] Patient Information 2014 [place], [place].  _______________________________________________________________________

## 2024-08-14 ENCOUNTER — Encounter (HOSPITAL_COMMUNITY): Payer: Self-pay

## 2024-08-14 ENCOUNTER — Other Ambulatory Visit: Payer: Self-pay

## 2024-08-14 ENCOUNTER — Encounter (HOSPITAL_COMMUNITY)
Admission: RE | Admit: 2024-08-14 | Discharge: 2024-08-14 | Disposition: A | Discharge status: Home / Self Care | Source: Ambulatory Visit | Attending: Orthopedic Surgery | Admitting: Orthopedic Surgery

## 2024-08-14 VITALS — BP 133/62 | HR 62 | Temp 98.1°F | Resp 16 | Ht 65.0 in | Wt 177.0 lb

## 2024-08-14 DIAGNOSIS — I10 Essential (primary) hypertension: Secondary | ICD-10-CM | POA: Insufficient documentation

## 2024-08-14 DIAGNOSIS — Z01818 Encounter for other preprocedural examination: Secondary | ICD-10-CM | POA: Diagnosis present

## 2024-08-14 HISTORY — DX: Unspecified asthma, uncomplicated: J45.909

## 2024-08-14 HISTORY — DX: Family history of other specified conditions: Z84.89

## 2024-08-14 LAB — SURGICAL PCR SCREEN
MRSA, PCR: NEGATIVE
Staphylococcus aureus: NEGATIVE

## 2024-08-14 NOTE — Progress Notes (Signed)
 Date of COVID positive in last 90 days:  No  PCP - Eligio Fairly, MD Cardiologist - N/A  Chest x-ray - N/A EKG - 08-14-24 Epic Stress Test - yes, 8+ years ago ECHO - N/A Cardiac Cath - N/A Pacemaker/ICD device last checked:N/A Spinal Cord Stimulator:N/A  Bowel Prep - N/A  Sleep Study - N/A CPAP -   Fasting Blood Sugar - N/A Checks Blood Sugar _____ times a day  Last dose of GLP1 agonist-  N/A GLP1 instructions:  Do not take after     Last dose of SGLT-2 inhibitors-  N/A SGLT-2 instructions:  Do not take after    Blood Thinner Instructions: N/A : Aspirin  Instructions:N/A Last Dose:  Activity level:  Can go up a flight of stairs and perform activities of daily living without stopping and without symptoms of chest pain or shortness of breath.  Anesthesia review: N/A  Patient denies shortness of breath, fever, cough and chest pain at PAT appointment  Patient verbalized understanding of instructions that were given to them at the PAT appointment. Patient was also instructed that they will need to review over the PAT instructions again at home before surgery.

## 2024-08-14 NOTE — H&P (Signed)
 TOTAL KNEE ADMISSION H&P  Patient is being admitted for right total knee arthroplasty.  Subjective:  Chief Complaint: Right knee pain.  HPI: Denise Steele, 69 y.o. female has a history of pain and functional disability in the right knee due to arthritis and has failed non-surgical conservative treatments for greater than 12 weeks to include NSAID's and/or analgesics, corticosteriod injections, viscosupplementation injections, and weight reduction as appropriate. Onset of symptoms was gradual, starting several years ago with gradually worsening course since that time. The patient noted prior procedures on the knee to include  arthroscopy on the right knee.  Patient currently rates pain in the right knee at 7 out of 10 with activity. Patient has worsening of pain with activity and weight bearing and pain that interferes with activities of daily living. Patient has evidence of advanced end-stage arthritis with bone-on-bone changes in the lateral and patellofemoral compartments by imaging studies. There is no active infection.  Patient Active Problem List   Diagnosis Date Noted   Encounter for screening colonoscopy 06/03/2022   Osteoarthritis of left hip 10/21/2020   OA (osteoarthritis) of hip 11/13/2019   Status post right hip replacement 11/13/2019    Past Medical History:  Diagnosis Date   Arthritis    hips and knees   Asthma    Sports induced   Family history of adverse reaction to anesthesia    Sister and mother N&V and BP drops   History of kidney stones    Hypertension    Medical history non-contributory    PONV (postoperative nausea and vomiting)     Past Surgical History:  Procedure Laterality Date   ABDOMINAL HYSTERECTOMY     ANKLE FRACTURE SURGERY Right    CARPAL TUNNEL RELEASE Right    COLONOSCOPY WITH PROPOFOL  N/A 06/03/2022   Procedure: COLONOSCOPY WITH PROPOFOL ;  Surgeon: Eartha Angelia Sieving, MD;  Location: [facility];  Service: Gastroenterology;  Laterality:  N/A;  815 ASA 2   CYSTOSCOPY N/A 05/30/2013   Procedure: CYSTOSCOPY;  Surgeon: Nathanel LELON Bunker, MD;  Location: [facility];  Service: Gynecology;  Laterality: N/A;   ENDOMETRIAL ABLATION     FRACTURE SURGERY Right 2011   leg   kidney stone removal     KNEE ARTHROSCOPY Right    LAPAROSCOPIC ASSISTED VAGINAL HYSTERECTOMY N/A 05/30/2013   Procedure: LAPAROSCOPIC ASSISTED VAGINAL HYSTERECTOMY;  Surgeon: Nathanel LELON Bunker, MD;  Location: [facility];  Service: Gynecology;  Laterality: N/A;  abdomen and vagins   SALPINGOOPHORECTOMY N/A 05/30/2013   Procedure: SALPINGO OOPHORECTOMY;  Surgeon: Nathanel LELON Bunker, MD;  Location: [facility];  Service: Gynecology;  Laterality: N/A;   TONSILLECTOMY     TOTAL HIP ARTHROPLASTY Right 11/13/2019   Procedure: TOTAL HIP ARTHROPLASTY ANTERIOR APPROACH;  Surgeon: Melodi Lerner, MD;  Location: [facility];  Service: Orthopedics;  Laterality: Right;    TOTAL HIP ARTHROPLASTY Left 10/21/2020   Procedure: TOTAL HIP ARTHROPLASTY ANTERIOR APPROACH;  Surgeon: Melodi Lerner, MD;  Location: [facility];  Service: Orthopedics;  Laterality: Left;     Prior to Admission medications  Medication Sig Start Date End Date Taking? Authorizing Provider  Cholecalciferol (VITAMIN D3) 125 MCG (5000 UT) TABS Take 5,000 Units by mouth in the morning.   Yes [provider]  estradiol  (ESTRACE ) 0.5 MG tablet Take 0.5 mg by mouth in the morning.   Yes [provider]  fexofenadine (ALLEGRA) 180 MG tablet Take 180 mg by mouth daily.   Yes [provider]  ibuprofen  (ADVIL ) 200 MG  tablet Take 400 mg by mouth every 6 (six) hours as needed for mild pain (pain score 1-3).   Yes [provider]  levocetirizine (XYZAL) 5 MG tablet Take 5 mg by mouth at bedtime as needed for allergies.   Yes [provider]  lisinopril (ZESTRIL) 30 MG tablet Take 30 mg by mouth in the morning. 10/14/19  Yes [provider]  albuterol (VENTOLIN HFA) 108 (90 Base)  MCG/ACT inhaler Inhale 1-2 puffs into the lungs every 6 (six) hours as needed (sports induced asthma).    [provider]  Biotin 5 MG CAPS Take 5 mg by mouth in the morning. Patient not taking: Reported on 08/09/2024    [provider]  Multiple Vitamin (MULTIVITAMIN WITH MINERALS) TABS tablet Take 1 tablet by mouth in the morning. Centrum Silver for Women Patient not taking: Reported on 08/09/2024    [provider]  Propylene Glycol (SYSTANE COMPLETE) 0.6 % SOLN Apply 1 drop to eye daily. Patient not taking: Reported on 08/09/2024    [provider]  TIRZEPATIDE [place] Inject 2.5 mg into the skin once a week. Patient not taking: Reported on 08/09/2024    [provider]  traZODone (DESYREL) 50 MG tablet Take 50 mg by mouth at bedtime as needed for sleep. Patient not taking: Reported on 08/09/2024 05/03/22   [provider]  vitamin E 180 MG (400 UNITS) capsule Take 400 Units by mouth in the morning. Patient not taking: Reported on 08/09/2024    [provider]    Allergies[1]  Social History   Socioeconomic History   Marital status: Married    Spouse name: Not on file   Number of children: Not on file   Years of education: Not on file   Highest education level: Not on file  Occupational History   Not on file  Tobacco Use   Smoking status: Former    Current packs/day: 0.00    Average packs/day: 0.3 packs/day for 15.0 years (3.8 ttl pk-yrs)    Types: Cigarettes    Start date: 8    Quit date: 44    Years since quitting: 41.0   Smokeless tobacco: Never   Tobacco comments:    quit over 35 years  Vaping Use   Vaping status: Never Used  Substance and Sexual Activity   Alcohol use: Yes    Comment: occ. glass of wine   Drug use: No   Sexual activity: Not on file  Other Topics Concern   Not on file  Social History Narrative   Not on file   Social Drivers of Health   Tobacco Use: Medium Risk (08/14/2024)   Patient History     Smoking Tobacco Use: Former    Smokeless Tobacco Use: Never    Passive Exposure: Not on [place] Strain: Not on file  Food Insecurity: Not on file  Transportation Needs: Not on file  Physical Activity: Not on file  Stress: Not on file  Social Connections: Not on file  Intimate Partner Violence: Not on file  Depression (EYV7-0): Not on file  Alcohol Screen: Not on file  Housing: Not on file  Utilities: Not on file  Health Literacy: Not on file    Tobacco Use: Medium Risk (08/14/2024)   Patient History    Smoking Tobacco Use: Former    Smokeless Tobacco Use: Never    Passive Exposure: Not on file   Social History   Substance and Sexual Activity  Alcohol Use  Yes   Comment: occ. glass of wine    No family history on file.  ROS  Objective:  Physical Exam:  General: Well-developed female in no apparent distress. - Right Hip: Normal motion, no discomfort.  - Right Knee: No effusion. Bruising anteriorly from recent fall. Slight valgus alignment. No intra-articular effusion. Range of motion approximately 0-125 degrees. Tenderness laterally and very slight medial tenderness. No instability noted.  - Gait: Antalgic pattern on the right.  Vital signs in last 24 hours: Temp:  [98.1 F (36.7 C)] 98.1 F (36.7 C) (01/14 1059) Pulse Rate:  [62] 62 (01/14 1059) Resp:  [16] 16 (01/14 1059) BP: (133)/(62) 133/62 (01/14 1059) SpO2:  [100 %] 100 % (01/14 1059) Weight:  [80.3 kg] 80.3 kg (01/14 1059)  Imaging Review Radiographs from October 2025 of both knees, including AP and lateral views of the right knee, demonstrate advanced end-stage arthritis with bone-on-bone changes in the lateral and patellofemoral compartments. Slight valgus alignment is also noted.  Assessment/Plan:  End stage arthritis, right knee   The patient history, physical examination, clinical judgment of the provider and imaging studies are consistent with end stage degenerative joint  disease of the right knee and total knee arthroplasty is deemed medically necessary. The treatment options including medical management, injection therapy arthroscopy and arthroplasty were discussed at length. The risks and benefits of total knee arthroplasty were presented and reviewed. The risks due to aseptic loosening, infection, stiffness, patella tracking problems, thromboembolic complications and other imponderables were discussed. The patient acknowledged the explanation, agreed to proceed with the plan and consent was signed. Patient is being admitted for inpatient treatment for surgery, pain control, PT, OT, prophylactic antibiotics, VTE prophylaxis, progressive ambulation and ADLs and discharge planning. The patient is planning to be discharged home.   Patient's anticipated LOS is less than 2 midnights, meeting these requirements: - Lives within 1 hour of care - Has a competent adult at home to recover with post-op recover - NO history of  - Chronic pain requiring opiods  - Diabetes  - Coronary Artery Disease  - Heart failure  - Heart attack  - Stroke  - DVT/VTE  - Cardiac arrhythmia  - Respiratory Failure/COPD  - Renal failure  - Anemia  - Advanced Liver disease    Therapy Plans: [facility] [place] Disposition: Home with daughter and husband Planned DVT Prophylaxis: Aspirin  81mg  BID DME Needed: None PCP: Eligio JAYSON Fairly, MD (clearance received) TXA: IV Allergies: PCN (hives) Metal Allergy: None Anesthesia Concerns: Nausea BMI: 29.5 Last HgbA1c: Not diabetic Pain Regimen: oxycodone , tramadol , gabapentin Pharmacy: [facility]   - Patient was instructed on what medications to stop prior to surgery. - Follow-up visit in 2 weeks with Dr. Melodi - Begin physical therapy following surgery - Pre-operative lab work as pre-surgical testing - Prescriptions will be provided in hospital at time of discharge  Corean Sender, PA-C Orthopedic Surgery [facility] [place]  [facility]       [1]  Allergies Allergen Reactions   Penicillins Hives    Did it involve swelling of the face/tongue/throat, SOB, or low BP? Unknown Did it involve sudden or severe rash/hives, skin peeling, or any reaction on the inside of your mouth or nose? Yes Did you need to seek medical attention at a hospital or doctor's office? No When did it last happen? Childhood reaction. If all above answers are NO, may proceed with cephalosporin use.  Tolerated Cephalosporin Date: 11/13/19.

## 2024-08-26 ENCOUNTER — Ambulatory Visit (HOSPITAL_COMMUNITY): Admitting: Anesthesiology

## 2024-08-26 ENCOUNTER — Encounter (HOSPITAL_COMMUNITY): Payer: Self-pay | Admitting: Orthopedic Surgery

## 2024-08-26 ENCOUNTER — Encounter (HOSPITAL_COMMUNITY): Admission: RE | Payer: Self-pay | Source: Home / Self Care

## 2024-08-26 ENCOUNTER — Other Ambulatory Visit: Payer: Self-pay

## 2024-08-26 ENCOUNTER — Observation Stay (HOSPITAL_COMMUNITY)
Admission: RE | Admit: 2024-08-26 | Discharge: 2024-08-27 | Disposition: A | Discharge status: Home / Self Care | Attending: Orthopedic Surgery | Admitting: Orthopedic Surgery

## 2024-08-26 DIAGNOSIS — M1711 Unilateral primary osteoarthritis, right knee: Principal | ICD-10-CM | POA: Insufficient documentation

## 2024-08-26 DIAGNOSIS — M25561 Pain in right knee: Secondary | ICD-10-CM | POA: Diagnosis present

## 2024-08-26 DIAGNOSIS — Z96643 Presence of artificial hip joint, bilateral: Secondary | ICD-10-CM | POA: Diagnosis not present

## 2024-08-26 DIAGNOSIS — J45909 Unspecified asthma, uncomplicated: Secondary | ICD-10-CM | POA: Insufficient documentation

## 2024-08-26 DIAGNOSIS — I10 Essential (primary) hypertension: Secondary | ICD-10-CM | POA: Insufficient documentation

## 2024-08-26 DIAGNOSIS — M179 Osteoarthritis of knee, unspecified: Principal | ICD-10-CM | POA: Diagnosis present

## 2024-08-26 DIAGNOSIS — Z87891 Personal history of nicotine dependence: Secondary | ICD-10-CM | POA: Insufficient documentation

## 2024-08-26 DIAGNOSIS — Z79899 Other long term (current) drug therapy: Secondary | ICD-10-CM | POA: Diagnosis not present

## 2024-08-26 MED ORDER — ALBUTEROL SULFATE (2.5 MG/3ML) 0.083% IN NEBU: 2.5000 mg | INHALATION_SOLUTION | Freq: Four times a day (QID) | RESPIRATORY_TRACT | Status: DC | PRN

## 2024-08-26 MED ORDER — BUPIVACAINE LIPOSOME 1.3 % IJ SUSP: 20.0000 mL | Freq: Once | INTRAMUSCULAR | Status: DC

## 2024-08-26 MED ORDER — ONDANSETRON HCL 4 MG/2ML IJ SOLN
INTRAMUSCULAR | Status: DC | PRN
  Administered 2024-08-26: 4 mg via INTRAVENOUS

## 2024-08-26 MED ORDER — 0.9 % SODIUM CHLORIDE (POUR BTL) OPTIME
TOPICAL | Status: DC | PRN
  Administered 2024-08-26: 1000 mL

## 2024-08-26 MED ORDER — DOCUSATE SODIUM 100 MG PO CAPS
100.0000 mg | ORAL_CAPSULE | Freq: Two times a day (BID) | ORAL | Status: DC
  Administered 2024-08-26 – 2024-08-27 (×3): 100 mg via ORAL
  Filled 2024-08-26 (×3): qty 1

## 2024-08-26 MED ORDER — METHOCARBAMOL 1000 MG/10ML IJ SOLN: 500.0000 mg | Freq: Four times a day (QID) | INTRAMUSCULAR | Status: DC | PRN

## 2024-08-26 MED ORDER — METHOCARBAMOL 500 MG PO TABS
500.0000 mg | ORAL_TABLET | Freq: Four times a day (QID) | ORAL | Status: DC | PRN
  Administered 2024-08-27: 500 mg via ORAL
  Filled 2024-08-26: qty 1

## 2024-08-26 MED ORDER — DEXAMETHASONE SOD PHOSPHATE PF 10 MG/ML IJ SOLN
8.0000 mg | Freq: Once | INTRAMUSCULAR | Status: AC
  Administered 2024-08-26: 8 mg via INTRAVENOUS

## 2024-08-26 MED ORDER — MIDAZOLAM HCL (PF) 2 MG/2ML IJ SOLN
1.0000 mg | Freq: Once | INTRAMUSCULAR | Status: AC
  Administered 2024-08-26: 1 mg via INTRAVENOUS
  Filled 2024-08-26: qty 2

## 2024-08-26 MED ORDER — CEFAZOLIN SODIUM-DEXTROSE 2-4 GM/100ML-% IV SOLN
2.0000 g | Freq: Four times a day (QID) | INTRAVENOUS | Status: AC
  Administered 2024-08-26 (×2): 2 g via INTRAVENOUS
  Filled 2024-08-26 (×2): qty 100

## 2024-08-26 MED ORDER — PROPOFOL 1000 MG/100ML IV EMUL
INTRAVENOUS | Status: AC
  Filled 2024-08-26: qty 100

## 2024-08-26 MED ORDER — BUPIVACAINE LIPOSOME 1.3 % IJ SUSP
INTRAMUSCULAR | Status: AC
  Filled 2024-08-26: qty 20

## 2024-08-26 MED ORDER — CEFAZOLIN SODIUM-DEXTROSE 2-4 GM/100ML-% IV SOLN
2.0000 g | INTRAVENOUS | Status: AC
  Administered 2024-08-26: 2 g via INTRAVENOUS
  Filled 2024-08-26: qty 100

## 2024-08-26 MED ORDER — TRANEXAMIC ACID-NACL 1000-0.7 MG/100ML-% IV SOLN
1000.0000 mg | INTRAVENOUS | Status: AC
  Administered 2024-08-26: 1000 mg via INTRAVENOUS
  Filled 2024-08-26: qty 100

## 2024-08-26 MED ORDER — METOCLOPRAMIDE HCL 5 MG PO TABS: 5.0000 mg | ORAL_TABLET | Freq: Three times a day (TID) | ORAL | Status: DC | PRN

## 2024-08-26 MED ORDER — ACETAMINOPHEN 10 MG/ML IV SOLN
1000.0000 mg | Freq: Four times a day (QID) | INTRAVENOUS | Status: DC
  Administered 2024-08-26: 1000 mg via INTRAVENOUS
  Filled 2024-08-26: qty 100

## 2024-08-26 MED ORDER — PHENOL 1.4 % MT LIQD: 1.0000 | OROMUCOSAL | Status: DC | PRN

## 2024-08-26 MED ORDER — ROPIVACAINE HCL 5 MG/ML IJ SOLN
INTRAMUSCULAR | Status: DC | PRN
  Administered 2024-08-26: 20 mL via PERINEURAL

## 2024-08-26 MED ORDER — OXYCODONE HCL 5 MG/5ML PO SOLN: 5.0000 mg | Freq: Once | ORAL | Status: DC | PRN

## 2024-08-26 MED ORDER — LACTATED RINGERS IV SOLN: INTRAVENOUS | Status: DC

## 2024-08-26 MED ORDER — BISACODYL 10 MG RE SUPP: 10.0000 mg | Freq: Every day | RECTAL | Status: DC | PRN

## 2024-08-26 MED ORDER — ONDANSETRON HCL 4 MG/2ML IJ SOLN: 4.0000 mg | Freq: Four times a day (QID) | INTRAMUSCULAR | Status: DC | PRN

## 2024-08-26 MED ORDER — ACETAMINOPHEN 500 MG PO TABS
1000.0000 mg | ORAL_TABLET | Freq: Four times a day (QID) | ORAL | Status: DC
  Administered 2024-08-26 – 2024-08-27 (×3): 1000 mg via ORAL
  Filled 2024-08-26 (×3): qty 2

## 2024-08-26 MED ORDER — FENTANYL CITRATE (PF) 50 MCG/ML IJ SOSY
50.0000 ug | PREFILLED_SYRINGE | Freq: Once | INTRAMUSCULAR | Status: AC
  Administered 2024-08-26: 50 ug via INTRAVENOUS
  Filled 2024-08-26: qty 1

## 2024-08-26 MED ORDER — ONDANSETRON HCL 4 MG/2ML IJ SOLN
INTRAMUSCULAR | Status: AC
  Filled 2024-08-26: qty 2

## 2024-08-26 MED ORDER — FLEET ENEMA RE ENEM: 1.0000 | ENEMA | Freq: Once | RECTAL | Status: DC | PRN

## 2024-08-26 MED ORDER — CHLORHEXIDINE GLUCONATE 0.12 % MT SOLN
15.0000 mL | Freq: Once | OROMUCOSAL | Status: AC
  Administered 2024-08-26: 15 mL via OROMUCOSAL

## 2024-08-26 MED ORDER — PHENYLEPHRINE HCL-NACL 20-0.9 MG/250ML-% IV SOLN
INTRAVENOUS | Status: DC | PRN
  Administered 2024-08-26: 35 ug/min via INTRAVENOUS

## 2024-08-26 MED ORDER — DIPHENHYDRAMINE HCL 12.5 MG/5ML PO ELIX: 12.5000 mg | ORAL_SOLUTION | ORAL | Status: DC | PRN

## 2024-08-26 MED ORDER — TRAMADOL HCL 50 MG PO TABS: 50.0000 mg | ORAL_TABLET | Freq: Four times a day (QID) | ORAL | Status: DC | PRN

## 2024-08-26 MED ORDER — PROPOFOL 500 MG/50ML IV EMUL
INTRAVENOUS | Status: DC | PRN
  Administered 2024-08-26: 100 ug/kg/min via INTRAVENOUS

## 2024-08-26 MED ORDER — ACETAMINOPHEN 325 MG PO TABS: 325.0000 mg | ORAL_TABLET | Freq: Four times a day (QID) | ORAL | Status: DC | PRN

## 2024-08-26 MED ORDER — ONDANSETRON HCL 4 MG PO TABS: 4.0000 mg | ORAL_TABLET | Freq: Four times a day (QID) | ORAL | Status: DC | PRN

## 2024-08-26 MED ORDER — ASPIRIN 81 MG PO CHEW
81.0000 mg | CHEWABLE_TABLET | Freq: Two times a day (BID) | ORAL | Status: DC
  Administered 2024-08-27: 81 mg via ORAL
  Filled 2024-08-26: qty 1

## 2024-08-26 MED ORDER — ORAL CARE MOUTH RINSE: 15.0000 mL | Freq: Once | OROMUCOSAL | Status: AC

## 2024-08-26 MED ORDER — OXYCODONE HCL 5 MG PO TABS: 5.0000 mg | ORAL_TABLET | Freq: Once | ORAL | Status: DC | PRN

## 2024-08-26 MED ORDER — SODIUM CHLORIDE (PF) 0.9 % IJ SOLN
INTRAMUSCULAR | Status: DC | PRN
  Administered 2024-08-26: 80 mL

## 2024-08-26 MED ORDER — CLONIDINE HCL (ANALGESIA) 100 MCG/ML EP SOLN
EPIDURAL | Status: DC | PRN
  Administered 2024-08-26: 50 ug

## 2024-08-26 MED ORDER — SODIUM CHLORIDE (PF) 0.9 % IJ SOLN
INTRAMUSCULAR | Status: AC
  Filled 2024-08-26: qty 50

## 2024-08-26 MED ORDER — PROPOFOL 10 MG/ML IV BOLUS
INTRAVENOUS | Status: DC | PRN
  Administered 2024-08-26 (×2): 10 mg via INTRAVENOUS

## 2024-08-26 MED ORDER — MENTHOL 3 MG MT LOZG: 1.0000 | LOZENGE | OROMUCOSAL | Status: DC | PRN

## 2024-08-26 MED ORDER — POVIDONE-IODINE 10 % EX SWAB: 2.0000 | Freq: Once | CUTANEOUS | Status: DC

## 2024-08-26 MED ORDER — METOCLOPRAMIDE HCL 5 MG/ML IJ SOLN: 5.0000 mg | Freq: Three times a day (TID) | INTRAMUSCULAR | Status: DC | PRN

## 2024-08-26 MED ORDER — BUPIVACAINE IN DEXTROSE 0.75-8.25 % IT SOLN
INTRATHECAL | Status: DC | PRN
  Administered 2024-08-26: 1.4 mL via INTRATHECAL

## 2024-08-26 MED ORDER — SODIUM CHLORIDE 0.9 % IV SOLN: INTRAVENOUS | Status: DC

## 2024-08-26 MED ORDER — OXYCODONE HCL 5 MG PO TABS
5.0000 mg | ORAL_TABLET | ORAL | Status: DC | PRN
  Administered 2024-08-26: 10 mg via ORAL
  Administered 2024-08-26: 5 mg via ORAL
  Administered 2024-08-27 (×2): 10 mg via ORAL
  Filled 2024-08-26: qty 2
  Filled 2024-08-26: qty 1
  Filled 2024-08-26 (×2): qty 2

## 2024-08-26 MED ORDER — STERILE WATER FOR IRRIGATION IR SOLN
Status: DC | PRN
  Administered 2024-08-26: 2000 mL

## 2024-08-26 MED ORDER — ESTRADIOL 0.5 MG PO TABS
0.5000 mg | ORAL_TABLET | Freq: Every morning | ORAL | Status: DC
  Administered 2024-08-27: 0.5 mg via ORAL
  Filled 2024-08-26: qty 1

## 2024-08-26 MED ORDER — POLYETHYLENE GLYCOL 3350 17 G PO PACK: 17.0000 g | PACK | Freq: Every day | ORAL | Status: DC | PRN

## 2024-08-26 MED ORDER — SODIUM CHLORIDE (PF) 0.9 % IJ SOLN
INTRAMUSCULAR | Status: AC
  Filled 2024-08-26: qty 10

## 2024-08-26 MED ORDER — DROPERIDOL 2.5 MG/ML IJ SOLN: 0.6250 mg | Freq: Once | INTRAMUSCULAR | Status: DC | PRN

## 2024-08-26 MED ORDER — DEXAMETHASONE SOD PHOSPHATE PF 10 MG/ML IJ SOLN
10.0000 mg | Freq: Once | INTRAMUSCULAR | Status: AC
  Administered 2024-08-27: 10 mg via INTRAVENOUS
  Filled 2024-08-26: qty 1

## 2024-08-26 MED ORDER — DEXAMETHASONE SOD PHOSPHATE PF 10 MG/ML IJ SOLN
INTRAMUSCULAR | Status: AC
  Filled 2024-08-26: qty 1

## 2024-08-26 MED ORDER — SODIUM CHLORIDE 0.9 % IR SOLN
Status: DC | PRN
  Administered 2024-08-26: 1000 mL

## 2024-08-26 MED ORDER — HYDROMORPHONE HCL 1 MG/ML IJ SOLN
0.5000 mg | INTRAMUSCULAR | Status: DC | PRN
  Administered 2024-08-26: 1 mg via INTRAVENOUS
  Filled 2024-08-26: qty 1

## 2024-08-26 MED ORDER — FENTANYL CITRATE (PF) 50 MCG/ML IJ SOSY: 25.0000 ug | PREFILLED_SYRINGE | INTRAMUSCULAR | Status: DC | PRN

## 2024-08-26 NOTE — Anesthesia Postprocedure Evaluation (Signed)
"   Anesthesia Post Note  Patient: Denise Steele  Procedure(s) Performed: ARTHROPLASTY, KNEE, TOTAL (Right: Knee)     Patient location during evaluation: PACU Anesthesia Type: Spinal Level of consciousness: awake and alert Pain management: pain level controlled Vital Signs Assessment: post-procedure vital signs reviewed and stable Respiratory status: spontaneous breathing and respiratory function stable Cardiovascular status: blood pressure returned to baseline and stable Postop Assessment: spinal receding Anesthetic complications: no   No notable events documented.  Last Vitals:  Vitals:   08/26/24 1300 08/26/24 1340  BP: 129/71 (!) 104/58  Pulse: (!) 54 (!) 56  Resp: 16 16  Temp:  (!) 36.2 C  SpO2: 98% 100%    Last Pain:  Vitals:   08/26/24 1300  PainSc: 0-No pain                 Epifanio Lamar BRAVO      "

## 2024-08-26 NOTE — Progress Notes (Signed)
 Orthopedic Tech Progress Note Patient Details:  Denise Steele [birth year] [id number] Applied CPM per order. Will remove at 4:22 pm.  CPM Right Knee CPM Right Knee: On Right Knee Flexion (Degrees): 40 Right Knee Extension (Degrees): 10  Post Interventions Patient Tolerated: Well Instructions Provided: Adjustment of device, Poper ambulation with device, Care of device Ortho Devices Type of Ortho Device: CPM padding Ortho Device/Splint Location: RLE Ortho Device/Splint Interventions: Ordered, Application, Adjustment   Post Interventions Patient Tolerated: Well Instructions Provided: Adjustment of device, Poper ambulation with device, Care of device  Morna Pink 08/26/2024, 12:23 PM

## 2024-08-26 NOTE — Anesthesia Procedure Notes (Signed)
 Spinal  Patient location during procedure: OR Start time: 08/26/2024 10:13 AM End time: 08/26/2024 10:20 AM Reason for block: surgical anesthesia  Staffing Performed: anesthesiologist  Authorized by: Epifanio Charleston, MD   Performed by: Epifanio Charleston, MD  Preanesthetic Checklist Completed: patient identified, IV checked, site marked, risks and benefits discussed, surgical consent, monitors and equipment checked, pre-op evaluation and timeout performed Spinal Block Patient position: sitting Prep: DuraPrep Patient monitoring: heart rate, cardiac monitor, continuous pulse ox and blood pressure Approach: right paramedian Location: L4-5 Injection technique: single-shot Needle Needle type: Quincke  Needle gauge: 22 G Needle length: 9 cm Assessment Sensory level: T4 Events: CSF return  Additional Notes X2 attempts made at midline approach unsuccessful 2/2 arthritis. X1 attempt via right paramedian approach successful with 22G Quincke. Free flowing CSF with aspiration before and after injection LA.

## 2024-08-26 NOTE — Care Plan (Signed)
 Ortho Bundle Case Management Note  Patient Details  Name: Murphy Bundick MRN: [id number] Date of Birth: August 02, 1955  RT TKA on 08/26/24  DCP: Home with daughter and husband  DME: No needs; has RW and cane  PT: [facility] [place]                   DME Arranged:  N/A DME Agency:  NA  HH Arranged:    HH Agency:     Additional Comments: Please contact me with any questions of if this plan should need to change.  Burnard Dross, Case Manager [facility] [phone]  Ext. [phone]   08/26/2024, 9:09 AM

## 2024-08-26 NOTE — Anesthesia Procedure Notes (Signed)
 Procedure Name: MAC Date/Time: 08/26/2024 10:11 AM  Performed by: Zulema Leita PARAS, CRNAPre-anesthesia Checklist: Patient identified, Emergency Drugs available, Suction available and Patient being monitored Oxygen  Delivery Method: Simple face mask

## 2024-08-26 NOTE — Plan of Care (Signed)
" °  Problem: Education: Goal: Knowledge of the prescribed therapeutic regimen will improve Outcome: Progressing   Problem: Bowel/Gastric: Goal: Gastrointestinal status for postoperative course will improve Outcome: Progressing   Problem: Cardiac: Goal: Ability to maintain an adequate cardiac output Outcome: Progressing Goal: Will show no evidence of cardiac arrhythmias Outcome: Progressing   Problem: Nutritional: Goal: Will attain and maintain optimal nutritional status Outcome: Progressing   Problem: Neurological: Goal: Will regain or maintain usual level of consciousness Outcome: Progressing   Problem: Clinical Measurements: Goal: Ability to maintain clinical measurements within normal limits Outcome: Progressing Goal: Postoperative complications will be avoided or minimized Outcome: Progressing   Problem: Respiratory: Goal: Will regain and/or maintain adequate ventilation Outcome: Progressing Goal: Respiratory status will improve Outcome: Progressing   Problem: Skin Integrity: Goal: Demonstrates signs of wound healing without infection Outcome: Progressing   Problem: Urinary Elimination: Goal: Will remain free from infection Outcome: Progressing Goal: Ability to achieve and maintain adequate urine output Outcome: Progressing   Problem: Education: Goal: Knowledge of General Education information will improve Description: Including pain rating scale, medication(s)/side effects and non-pharmacologic comfort measures Outcome: Progressing   Problem: Health Behavior/Discharge Planning: Goal: Ability to manage health-related needs will improve Outcome: Progressing   Problem: Clinical Measurements: Goal: Ability to maintain clinical measurements within normal limits will improve Outcome: Progressing Goal: Will remain free from infection Outcome: Progressing Goal: Diagnostic test results will improve Outcome: Progressing Goal: Respiratory complications will  improve Outcome: Progressing Goal: Cardiovascular complication will be avoided Outcome: Progressing   Problem: Activity: Goal: Risk for activity intolerance will decrease Outcome: Progressing   Problem: Nutrition: Goal: Adequate nutrition will be maintained Outcome: Progressing   Problem: Coping: Goal: Level of anxiety will decrease Outcome: Progressing   Problem: Elimination: Goal: Will not experience complications related to bowel motility Outcome: Progressing Goal: Will not experience complications related to urinary retention Outcome: Progressing   Problem: Pain Managment: Goal: General experience of comfort will improve and/or be controlled Outcome: Progressing   Problem: Safety: Goal: Ability to remain free from injury will improve Outcome: Progressing   Problem: Skin Integrity: Goal: Risk for impaired skin integrity will decrease Outcome: Progressing   Problem: Education: Goal: Knowledge of the prescribed therapeutic regimen will improve Outcome: Progressing Goal: Individualized Educational Video(s) Outcome: Progressing   Problem: Activity: Goal: Ability to avoid complications of mobility impairment will improve Outcome: Progressing Goal: Range of joint motion will improve Outcome: Progressing   Problem: Clinical Measurements: Goal: Postoperative complications will be avoided or minimized Outcome: Progressing   Problem: Pain Management: Goal: Pain level will decrease with appropriate interventions Outcome: Progressing   Problem: Skin Integrity: Goal: Will show signs of wound healing Outcome: Progressing   "

## 2024-08-26 NOTE — Interval H&P Note (Signed)
 History and Physical Interval Note:  08/26/2024 9:16 AM  Denise Steele  has presented today for surgery, with the diagnosis of Right knee osteoarthritis.  The various methods of treatment have been discussed with the patient and family. After consideration of risks, benefits and other options for treatment, the patient has consented to  Procedures: ARTHROPLASTY, KNEE, TOTAL (Right) as a surgical intervention.  The patient's history has been reviewed, patient examined, no change in status, stable for surgery.  I have reviewed the patient's chart and labs.  Questions were answered to the patient's satisfaction.     [name]

## 2024-08-26 NOTE — Anesthesia Procedure Notes (Signed)
 Anesthesia Regional Block: Adductor canal block   Pre-Anesthetic Checklist: , timeout performed,  Correct Patient, Correct Site, Correct Laterality,  Correct Procedure, Correct Position, site marked,  Risks and benefits discussed,  Surgical consent,  Pre-op evaluation,  At surgeon's request and post-op pain management  Laterality: Right  Prep: chloraprep       Needles:  Injection technique: Single-shot  Needle Type: Echogenic Needle     Needle Length: 9cm  Needle Gauge: 21     Additional Needles:   Procedures:,,,, ultrasound used (permanent image in chart),,    Narrative:  Start time: 08/26/2024 9:45 AM End time: 08/26/2024 9:51 AM Injection made incrementally with aspirations every 5 mL.  Performed by: Personally  Anesthesiologist: Epifanio Charleston, MD

## 2024-08-26 NOTE — Discharge Instructions (Signed)
Gaynelle Arabian, MD Total Joint Specialist [facility] [place] [address], [address] [place], [place] [address] [phone]  TOTAL KNEE REPLACEMENT POSTOPERATIVE DIRECTIONS    Knee Rehabilitation, Guidelines Following Surgery  Results after knee surgery are often greatly improved when you follow the exercise, range of motion and muscle strengthening exercises prescribed by your doctor. Safety measures are also important to protect the knee from further injury. If any of these exercises cause you to have increased pain or swelling in your knee joint, decrease the amount until you are comfortable again and slowly increase them. If you have problems or questions, call your caregiver or physical therapist for advice.   BLOOD CLOT PREVENTION Take an 81 mg Aspirin two times a day for three weeks following surgery. Then take an 81 mg Aspirin once a day for three weeks. Then discontinue Aspirin. You may resume your vitamins/supplements upon discharge from the hospital. Do not take any NSAIDs (Advil, Aleve, Ibuprofen, Meloxicam, etc.) until you are 3 weeks out from surgery.  HOME CARE INSTRUCTIONS  Remove items at home which could result in a fall. This includes throw rugs or furniture in walking pathways.  ICE to the affected knee as much as tolerated. Icing helps control swelling. If the swelling is well controlled you will be more comfortable and rehab easier. Continue to use ice on the knee for pain and swelling from surgery. You may notice swelling that will progress down to the foot and ankle. This is normal after surgery. Elevate the leg when you are not up walking on it.    Continue to use the breathing machine which will help keep your temperature down. It is common for your temperature to cycle up and down following surgery, especially at night when you are not up moving around and exerting yourself. The breathing machine keeps your lungs expanded and your temperature down. Do  not place pillow under the operative knee, focus on keeping the knee straight while resting  DIET You may resume your previous home diet once you are discharged from the hospital.  DRESSING / WOUND CARE / SHOWERING Keep your bulky bandage on for 2 days. On the third post-operative day you may remove the Ace bandage and gauze. There is a waterproof adhesive bandage on your skin which will stay in place until your first follow-up appointment. Once you remove this you will not need to place another bandage You may begin showering 3 days following surgery, but do not submerge the incision under water.  ACTIVITY For the first 5 days, the key is rest and control of pain and swelling Do your home exercises twice a day starting on post-operative day 3. On the days you go to physical therapy, just do the home exercises once that day. You should rest, ice and elevate the leg for 50 minutes out of every hour. Get up and walk/stretch for 10 minutes per hour. After 5 days you can increase your activity slowly as tolerated. Walk with your walker as instructed. Use the walker until you are comfortable transitioning to a cane. Walk with the cane in the opposite hand of the operative leg. You may discontinue the cane once you are comfortable and walking steadily. Avoid periods of inactivity such as sitting longer than an hour when not asleep. This helps prevent blood clots.  You may discontinue the knee immobilizer once you are able to perform a straight leg raise while lying down. You may resume a sexual relationship in one month  or when given the OK by your doctor.  You may return to work once you are cleared by your doctor.  Do not drive a car for 6 weeks or until released by your surgeon.  Do not drive while taking narcotics.  TED HOSE STOCKINGS Wear the elastic stockings on both legs for three weeks following surgery during the day. You may remove them at night for sleeping.  WEIGHT BEARING Weight  bearing as tolerated with assist device (walker, cane, etc) as directed, use it as long as suggested by your surgeon or therapist, typically at least 4-6 weeks.  POSTOPERATIVE CONSTIPATION PROTOCOL Constipation - defined medically as fewer than three stools per week and severe constipation as less than one stool per week.  One of the most common issues patients have following surgery is constipation.  Even if you have a regular bowel pattern at home, your normal regimen is likely to be disrupted due to multiple reasons following surgery.  Combination of anesthesia, postoperative narcotics, change in appetite and fluid intake all can affect your bowels.  In order to avoid complications following surgery, here are some recommendations in order to help you during your recovery period.  Colace (docusate) - Pick up an over-the-counter form of Colace or another stool softener and take twice a day as long as you are requiring postoperative pain medications.  Take with a full glass of water daily.  If you experience loose stools or diarrhea, hold the colace until you stool forms back up. If your symptoms do not get better within 1 week or if they get worse, check with your doctor. Dulcolax (bisacodyl) - Pick up over-the-counter and take as directed by the product packaging as needed to assist with the movement of your bowels.  Take with a full glass of water.  Use this product as needed if not relieved by Colace only.  MiraLax (polyethylene glycol) - Pick up over-the-counter to have on hand. MiraLax is a solution that will increase the amount of water in your bowels to assist with bowel movements.  Take as directed and can mix with a glass of water, juice, soda, coffee, or tea. Take if you go more than two days without a movement. Do not use MiraLax more than once per day. Call your doctor if you are still constipated or irregular after using this medication for 7 days in a row.  If you continue to have problems  with postoperative constipation, please contact the office for further assistance and recommendations.  If you experience "the worst abdominal pain ever" or develop nausea or vomiting, please contact the office immediatly for further recommendations for treatment.  ITCHING If you experience itching with your medications, try taking only a single pain pill, or even half a pain pill at a time.  You can also use Benadryl over the counter for itching or also to help with sleep.   MEDICATIONS See your medication summary on the "After Visit Summary" that the nursing staff will review with you prior to discharge.  You may have some home medications which will be placed on hold until you complete the course of blood thinner medication.  It is important for you to complete the blood thinner medication as prescribed by your surgeon.  Continue your approved medications as instructed at time of discharge.  PRECAUTIONS If you experience chest pain or shortness of breath - call 911 immediately for transfer to the hospital emergency department.  If you develop a fever greater that 101 F,  purulent drainage from wound, increased redness or drainage from wound, foul odor from the wound/dressing, or calf pain - CONTACT YOUR SURGEON.                                                   FOLLOW-UP APPOINTMENTS Make sure you keep all of your appointments after your operation with your surgeon and caregivers. You should call the office at the above phone number and make an appointment for approximately two weeks after the date of your surgery or on the date instructed by your surgeon outlined in the "After Visit Summary".  RANGE OF MOTION AND STRENGTHENING EXERCISES  Rehabilitation of the knee is important following a knee injury or an operation. After just a few days of immobilization, the muscles of the thigh which control the knee become weakened and shrink (atrophy). Knee exercises are designed to build up the tone and  strength of the thigh muscles and to improve knee motion. Often times heat used for twenty to thirty minutes before working out will loosen up your tissues and help with improving the range of motion but do not use heat for the first two weeks following surgery. These exercises can be done on a training (exercise) mat, on the floor, on a table or on a bed. Use what ever works the best and is most comfortable for you Knee exercises include:  Leg Lifts - While your knee is still immobilized in a splint or cast, you can do straight leg raises. Lift the leg to 60 degrees, hold for 3 sec, and slowly lower the leg. Repeat 10-20 times 2-3 times daily. Perform this exercise against resistance later as your knee gets better.  Quad and Hamstring Sets - Tighten up the muscle on the front of the thigh (Quad) and hold for 5-10 sec. Repeat this 10-20 times hourly. Hamstring sets are done by pushing the foot backward against an object and holding for 5-10 sec. Repeat as with quad sets.  Leg Slides: Lying on your back, slowly slide your foot toward your buttocks, bending your knee up off the floor (only go as far as is comfortable). Then slowly slide your foot back down until your leg is flat on the floor again. Angel Wings: Lying on your back spread your legs to the side as far apart as you can without causing discomfort.  A rehabilitation program following serious knee injuries can speed recovery and prevent re-injury in the future due to weakened muscles. Contact your doctor or a physical therapist for more information on knee rehabilitation.   POST-OPERATIVE OPIOID TAPER INSTRUCTIONS: It is important to wean off of your opioid medication as soon as possible. If you do not need pain medication after your surgery it is ok to stop day one. Opioids include: Codeine, Hydrocodone(Norco, Vicodin), Oxycodone(Percocet, oxycontin) and hydromorphone amongst others.  Long term and even short term use of opiods can  cause: Increased pain response Dependence Constipation Depression Respiratory depression And more.  Withdrawal symptoms can include Flu like symptoms Nausea, vomiting And more Techniques to manage these symptoms Hydrate well Eat regular healthy meals Stay active Use relaxation techniques(deep breathing, meditating, yoga) Do Not substitute Alcohol to help with tapering If you have been on opioids for less than two weeks and do not have pain than it is ok to stop all together.  Plan  to wean off of opioids This plan should start within one week post op of your joint replacement. Maintain the same interval or time between taking each dose and first decrease the dose.  Cut the total daily intake of opioids by one tablet each day Next start to increase the time between doses. The last dose that should be eliminated is the evening dose.   IF YOU ARE TRANSFERRED TO A SKILLED REHAB FACILITY If the patient is transferred to a skilled rehab facility following release from the hospital, a list of the current medications will be sent to the facility for the patient to continue.  When discharged from the skilled rehab facility, please have the facility set up the patient's Home Health Physical Therapy prior to being released. Also, the skilled facility will be responsible for providing the patient with their medications at time of release from the facility to include their pain medication, the muscle relaxants, and their blood thinner medication. If the patient is still at the rehab facility at time of the two week follow up appointment, the skilled rehab facility will also need to assist the patient in arranging follow up appointment in our office and any transportation needs.  MAKE SURE YOU:  Understand these instructions.  Get help right away if you are not doing well or get worse.   DENTAL ANTIBIOTICS:  In most cases prophylactic antibiotics for Dental procdeures after total joint surgery are  not necessary.  Exceptions are as follows:  1. History of prior total joint infection  2. Severely immunocompromised (Organ Transplant, cancer chemotherapy, Rheumatoid biologic meds such as Humera)  3. Poorly controlled diabetes (A1C &gt; 8.0, blood glucose over 200)  If you have one of these conditions, contact your surgeon for an antibiotic prescription, prior to your dental procedure.    Pick up stool softner and laxative for home use following surgery while on pain medications. Do not submerge incision under water. Please use good hand washing techniques while changing dressing each day. May shower starting three days after surgery. Please use a clean towel to pat the incision dry following showers. Continue to use ice for pain and swelling after surgery. Do not use any lotions or creams on the incision until instructed by your surgeon.  

## 2024-08-26 NOTE — Op Note (Signed)
 "    OPERATIVE REPORT-TOTAL KNEE ARTHROPLASTY   Pre-operative diagnosis- Osteoarthritis  Right knee(s)  Post-operative diagnosis- Osteoarthritis Right knee(s)  Procedure-  Right  Total Knee Arthroplasty  Surgeon- Dempsey GAILS. Lurlie Wigen, MD  Assistant- Corean Sender, PA-C   Anesthesia-  Adductor canal block and spinal  EBL-25 ml   Drains None  Tourniquet time-  Total Tourniquet Time Documented: Thigh (Right) - 31 minutes Total: Thigh (Right) - 31 minutes     Complications- None  Condition-PACU - hemodynamically stable.   Brief Clinical Note  Denise Steele is a 69 y.o. year old female with end stage OA of her right knee with progressively worsening pain and dysfunction. She has constant pain, with activity and at rest and significant functional deficits with difficulties even with ADLs. She has had extensive non-op management including analgesics, injections of cortisone and viscosupplements, and home exercise program, but remains in significant pain with significant dysfunction.Radiographs show bone on bone arthritis lateral and patellofemoral. She presents now for right Total Knee Arthroplasty.     Procedure in detail---   The patient is brought into the operating room and positioned supine on the operating table. After successful administration of  adductor canal block and spinal,   a tourniquet is placed high on the  Right thigh(s) and the lower extremity is prepped and draped in the usual sterile fashion. Time out is performed by the operating team and then the  Right lower extremity is wrapped in Esmarch, knee flexed and the tourniquet inflated to 300 mmHg.       A midline incision is made with a ten blade through the subcutaneous tissue to the level of the extensor mechanism. A fresh blade is used to make a medial parapatellar arthrotomy. Soft tissue over the proximal medial tibia is subperiosteally elevated to the joint line with a knife and into the semimembranosus bursa with a  Cobb elevator. Soft tissue over the proximal lateral tibia is elevated with attention being paid to avoiding the patellar tendon on the tibial tubercle. The patella is everted, knee flexed 90 degrees and the ACL and PCL are removed. Findings are bone on bone lateral and patellofemoral with  large global osteophytes        The drill is used to create a starting hole in the distal femur and the canal is thoroughly irrigated with sterile saline to remove the fatty contents. The 5 degree Right  valgus alignment guide is placed into the femoral canal and the distal femoral cutting block is pinned to remove 10 mm off the distal femur. Resection is made with an oscillating saw.      The tibia is subluxed forward and the menisci are removed. The extramedullary alignment guide is placed referencing proximally at the medial aspect of the tibial tubercle and distally along the second metatarsal axis and tibial crest. The block is pinned to remove 2mm off the more deficient lateral  side. Resection is made with an oscillating saw. Size 5is the most appropriate size for the tibia and the proximal tibia is prepared with the modular drill and keel punch for that size.      The femoral sizing guide is placed and size 6 is most appropriate. Rotation is marked off the epicondylar axis and confirmed by creating a rectangular flexion gap at 90 degrees. The size 6 cutting block is pinned in this rotation and the anterior, posterior and chamfer cuts are made with the oscillating saw. The intercondylar block is then placed and that  cut is made.      Trial size 5 tibial component, trial size 6 narrow posterior stabilized femur and a 6  mm posterior stabilized rotating platform insert trial is placed. Full extension is achieved with excellent varus/valgus and anterior/posterior balance throughout full range of motion. The patella is everted and thickness measured to be 22  mm. Free hand resection is taken to 12 mm, a 35 template is  placed, lug holes are drilled, trial patella is placed, and it tracks normally. Osteophytes are removed off the posterior femur with the trial in place. All trials are removed and the cut bone surfaces prepared with pulsatile lavage. Cement is mixed and once ready for implantation, the size 5 tibial implant, size  6 narrow posterior stabilized femoral component, and the size 35 patella are cemented in place and the patella is held with the clamp. The trial insert is placed and the knee held in full extension. The Exparel  (20 ml mixed with 60 ml saline) is injected into the extensor mechanism, posterior capsule, medial and lateral gutters and subcutaneous tissues.  All extruded cement is removed and once the cement is hard the permanent 6 mm posterior stabilized rotating platform insert is placed into the tibial tray.      The wound is copiously irrigated with saline solution and the extensor mechanism closed with # 0 Stratofix suture. The tourniquet is released for a total tourniquet time of 31  minutes. Flexion against gravity is 140 degrees and the patella tracks normally. Subcutaneous tissue is closed with 2.0 vicryl and subcuticular with running 4.0 Monocryl. The incision is cleaned and dried and steri-strips and a bulky sterile dressing are applied. The limb is placed into a knee immobilizer and the patient is awakened and transported to recovery in stable condition.      Please note that a surgical assistant was a medical necessity for this procedure in order to perform it in a safe and expeditious manner. Surgical assistant was necessary to retract the ligaments and vital neurovascular structures to prevent injury to them and also necessary for proper positioning of the limb to allow for anatomic placement of the prosthesis.   Dempsey ROCKFORD April Carlyon, MD    08/26/2024, 11:21 AM   "

## 2024-08-26 NOTE — Transfer of Care (Signed)
 Immediate Anesthesia Transfer of Care Note  Patient: Denise Steele  Procedure(s) Performed: ARTHROPLASTY, KNEE, TOTAL (Right: Knee)  Patient Location: PACU  Anesthesia Type:MAC and Spinal  Level of Consciousness: awake, alert , and oriented  Airway & Oxygen  Therapy: Patient Spontanous Breathing and Patient connected to face mask oxygen   Post-op Assessment: Report given to RN and Post -op Vital signs reviewed and stable  Post vital signs: Reviewed and stable  Last Vitals:  Vitals Value Taken Time  BP    Temp    Pulse 63 08/26/24 11:44  Resp    SpO2 100 % 08/26/24 11:44    Last Pain:  Vitals:   08/26/24 0906  PainSc: 0-No pain         Complications: No notable events documented.

## 2024-08-26 NOTE — Anesthesia Preprocedure Evaluation (Addendum)
"                                    Anesthesia Evaluation  Patient identified by MRN, date of birth, ID band Patient awake    Reviewed: Allergy & Precautions, NPO status , Patient's Chart, lab work & pertinent test results  History of Anesthesia Complications (+) PONV and history of anesthetic complications  Airway Mallampati: II  TM Distance: >3 FB Neck ROM: Full    Dental  (+) Dental Advisory Given   Pulmonary asthma , former smoker   breath sounds clear to auscultation       Cardiovascular hypertension, Pt. on medications  Rhythm:Regular Rate:Normal     Neuro/Psych negative neurological ROS     GI/Hepatic negative GI ROS, Neg liver ROS,,,  Endo/Other  negative endocrine ROS    Renal/GU negative Renal ROS     Musculoskeletal  (+) Arthritis ,    Abdominal   Peds  Hematology negative hematology ROS (+)   Anesthesia Other Findings   Reproductive/Obstetrics                              Anesthesia Physical Anesthesia Plan  ASA: 2  Anesthesia Plan: Spinal   Post-op Pain Management: Regional block* and Ofirmev  IV (intra-op)*   Induction:   PONV Risk Score and Plan: 3 and Propofol  infusion, Dexamethasone , Ondansetron  and Treatment may vary due to age or medical condition  Airway Management Planned: Natural Airway and Simple Face Mask  Additional Equipment:   Intra-op Plan:   Post-operative Plan:   Informed Consent: I have reviewed the patients History and Physical, chart, labs and discussed the procedure including the risks, benefits and alternatives for the proposed anesthesia with the patient or authorized representative who has indicated his/her understanding and acceptance.       Plan Discussed with: CRNA  Anesthesia Plan Comments:         Anesthesia Quick Evaluation  "

## 2024-08-26 NOTE — Evaluation (Signed)
 Physical Therapy Evaluation Patient Details Name: Denise Steele MRN: [id number] DOB: 12-15-1955 Today's Date: 08/26/2024  History of Present Illness  69 yo female s/p R TKA on 08/26/24. PMH: arthritis, HTN, PONV, R THA 2021, L THA 2022, ankle fx-s/p ORIF,  Clinical Impression  Pt is s/p TKA resulting in the deficits listed below (see PT Problem List).  Pt doing well, reports minimal pain at rest. Amb ~ 65' with RW and min A. Anticipate steady progress in acute setting  Pt will benefit from acute skilled PT to increase their independence and safety with mobility to allow discharge.          If plan is discharge home, recommend the following: A little help with bathing/dressing/bathroom;Assistance with cooking/housework;Help with stairs or ramp for entrance;Assist for transportation   Can travel by private vehicle        Equipment Recommendations None recommended by PT  Recommendations for Other Services       Functional Status Assessment Patient has had a recent decline in their functional status and demonstrates the ability to make significant improvements in function in a reasonable and predictable amount of time.     Precautions / Restrictions Precautions Precautions: Fall;Knee Restrictions Weight Bearing Restrictions Per Provider Order: No Other Position/Activity Restrictions: WBAT      Mobility  Bed Mobility Overal bed mobility: Needs Assistance Bed Mobility: Supine to Sit     Supine to sit: Supervision     General bed mobility comments: for safety    Transfers Overall transfer level: Needs assistance Equipment used: Rolling walker (2 wheels) Transfers: Sit to/from Stand Sit to Stand: Min assist           General transfer comment: cues for hand placement and RLE position    Ambulation/Gait Ambulation/Gait assistance: Contact guard assist, Min assist Gait Distance (Feet): 60 Feet Assistive device: Rolling walker (2 wheels) Gait Pattern/deviations:  Step-to pattern Gait velocity: decr     General Gait Details: cues for sequence and RW position  Stairs            Wheelchair Mobility     Tilt Bed    Modified Rankin (Stroke Patients Only)       Balance Overall balance assessment: Mild deficits observed, not formally tested                                           Pertinent Vitals/Pain Pain Assessment Pain Assessment: 0-10 Pain Score: 5  Pain Location: right knee Pain Descriptors / Indicators: Sore Pain Intervention(s): Limited activity within patient's tolerance, Monitored during session, Premedicated before session, Repositioned, Ice applied    Home Living Family/patient expects to be discharged to:: Private residence Living Arrangements: Spouse/significant other;Children Available Help at Discharge: Family Type of Home: House Home Access: Stairs to enter   Secretary/administrator of Steps: 1 Alternate Level Stairs-Number of Steps: flight Home Layout: Two level Home Equipment: Agricultural Consultant (2 wheels);Cane - single point      Prior Function Prior Level of Function : Independent/Modified Independent;Driving                     Extremity/Trunk Assessment   Upper Extremity Assessment Upper Extremity Assessment: Overall WFL for tasks assessed    Lower Extremity Assessment Lower Extremity Assessment: RLE deficits/detail RLE Deficits / Details: ankle WFL, quads 3/5 (~ 6 degree quad ladg with SLR repetition, hip flexors  3/5-limitations d/t anticipated post op deficits       Communication   Communication Communication: No apparent difficulties    Cognition Arousal: Alert Behavior During Therapy: WFL for tasks assessed/performed   PT - Cognitive impairments: No apparent impairments                         Following commands: Intact       Cueing Cueing Techniques: Verbal cues     General Comments      Exercises Total Joint Exercises Ankle  Circles/Pumps: AROM, Both, 10 reps   Assessment/Plan    PT Assessment Patient needs continued PT services  PT Problem List Decreased strength;Decreased range of motion;Decreased balance;Decreased knowledge of use of DME;Pain;Decreased mobility;Decreased activity tolerance       PT Treatment Interventions DME instruction;Therapeutic exercise;Gait training;Stair training;Functional mobility training;Patient/family education;Therapeutic activities    PT Goals (Current goals can be found in the Care Plan section)  Acute Rehab PT Goals PT Goal Formulation: With patient Time For Goal Achievement: 09/02/24 Potential to Achieve Goals: Good    Frequency 7X/week     Co-evaluation               AM-PAC PT 6 Clicks Mobility  Outcome Measure Help needed turning from your back to your side while in a flat bed without using bedrails?: A Little Help needed moving from lying on your back to sitting on the side of a flat bed without using bedrails?: A Little Help needed moving to and from a bed to a chair (including a wheelchair)?: A Little Help needed standing up from a chair using your arms (e.g., wheelchair or bedside chair)?: A Little Help needed to walk in hospital room?: A Little Help needed climbing 3-5 steps with a railing? : A Lot 6 Click Score: 17    End of Session Equipment Utilized During Treatment: Gait belt;Right knee immobilizer (KI d/t slight quad lag) Activity Tolerance: Patient tolerated treatment well Patient left: with call bell/phone within reach;in chair;with chair alarm set;with family/visitor present;with nursing/sitter in room Nurse Communication: Mobility status PT Visit Diagnosis: Other abnormalities of gait and mobility (R26.89);Difficulty in walking, not elsewhere classified (R26.2)    Time: 8353-8287 PT Time Calculation (min) (ACUTE ONLY): 26 min   Charges:   PT Evaluation $PT Eval Low Complexity: 1 Low PT Treatments $Gait Training: 8-22 mins PT  General Charges $$ ACUTE PT VISIT: 1 Visit         Darrion Wyszynski, PT  Acute Rehab Dept [facility]) [phone]  08/26/2024   [facility] 08/26/2024, 5:20 PM

## 2024-08-27 ENCOUNTER — Other Ambulatory Visit (HOSPITAL_COMMUNITY): Payer: Self-pay

## 2024-08-27 ENCOUNTER — Encounter (HOSPITAL_COMMUNITY): Payer: Self-pay | Admitting: Orthopedic Surgery

## 2024-08-27 DIAGNOSIS — M1711 Unilateral primary osteoarthritis, right knee: Secondary | ICD-10-CM | POA: Diagnosis not present

## 2024-08-27 LAB — CBC
HCT: 38.5 % (ref 36.0–46.0)
Hemoglobin: 12.3 g/dL (ref 12.0–15.0)
MCH: 30.7 pg (ref 26.0–34.0)
MCHC: 31.9 g/dL (ref 30.0–36.0)
MCV: 96 fL (ref 80.0–100.0)
Platelets: 211 10*3/uL (ref 150–400)
RBC: 4.01 MIL/uL (ref 3.87–5.11)
RDW: 12 % (ref 11.5–15.5)
WBC: 12.6 10*3/uL — ABNORMAL HIGH (ref 4.0–10.5)
nRBC: 0 % (ref 0.0–0.2)

## 2024-08-27 LAB — BASIC METABOLIC PANEL WITH GFR
Anion gap: 8 (ref 5–15)
BUN: 12 mg/dL (ref 8–23)
CO2: 24 mmol/L (ref 22–32)
Calcium: 8.7 mg/dL — ABNORMAL LOW (ref 8.9–10.3)
Chloride: 106 mmol/L (ref 98–111)
Creatinine, Ser: 0.77 mg/dL (ref 0.44–1.00)
GFR, Estimated: >60 mL/min
Glucose, Bld: 123 mg/dL — ABNORMAL HIGH (ref 70–99)
Potassium: 5 mmol/L (ref 3.5–5.1)
Sodium: 138 mmol/L (ref 135–145)

## 2024-08-27 MED ORDER — SODIUM CHLORIDE 0.9 % IV BOLUS
500.0000 mL | Freq: Once | INTRAVENOUS | Status: AC
  Administered 2024-08-27: 500 mL via INTRAVENOUS

## 2024-08-27 MED ORDER — OXYCODONE HCL 5 MG PO TABS
5.0000 mg | ORAL_TABLET | ORAL | 0 refills | Status: AC | PRN
  Filled 2024-08-27: qty 42, 7d supply, fill #0

## 2024-08-27 MED ORDER — TRAMADOL HCL 50 MG PO TABS
50.0000 mg | ORAL_TABLET | Freq: Four times a day (QID) | ORAL | 0 refills | Status: AC | PRN
  Filled 2024-08-27: qty 40, 5d supply, fill #0

## 2024-08-27 MED ORDER — ONDANSETRON HCL 4 MG PO TABS
4.0000 mg | ORAL_TABLET | Freq: Four times a day (QID) | ORAL | 0 refills | Status: AC | PRN
  Filled 2024-08-27: qty 20, 5d supply, fill #0

## 2024-08-27 MED ORDER — METHOCARBAMOL 500 MG PO TABS
500.0000 mg | ORAL_TABLET | Freq: Four times a day (QID) | ORAL | 0 refills | Status: AC | PRN
  Filled 2024-08-27: qty 40, 10d supply, fill #0

## 2024-08-27 MED ORDER — ASPIRIN 81 MG PO CHEW
81.0000 mg | CHEWABLE_TABLET | Freq: Two times a day (BID) | ORAL | 0 refills | Status: AC
  Filled 2024-08-27: qty 63, 32d supply, fill #0

## 2024-08-27 NOTE — TOC Transition Note (Signed)
 Transition of Care [facility]) - Discharge Note   Patient Details  Name: Denise Steele MRN: [id number] Date of Birth: 01/14/1956  Transition of Care [facility]) CM/SW Contact:  NORMAN ASPEN, LCSW Phone Number: 08/27/2024, 10:57 AM   Clinical Narrative:    Met with pt who confirms she has needed DME in the home.  OPPT already arranged with [facility] ([place]).  No further IP CM needs.   Final next level of care: OP Rehab Barriers to Discharge: No Barriers Identified   Patient Goals and CMS Choice Patient states their goals for this hospitalization and ongoing recovery are:: return home          Discharge Placement                       Discharge Plan and Services Additional resources added to the After Visit Summary for                  DME Arranged: N/A DME Agency: NA                  Social Drivers of Health (SDOH) Interventions SDOH Screenings   Food Insecurity: No Food Insecurity (08/26/2024)  Housing: Low Risk (08/26/2024)  Transportation Needs: No Transportation Needs (08/26/2024)  Utilities: Not At Risk (08/26/2024)  Social Connections: Unknown (08/26/2024)  Tobacco Use: Medium Risk (08/26/2024)     Readmission Risk Interventions     No data to display

## 2024-08-27 NOTE — Progress Notes (Signed)
 Physical Therapy Treatment Patient Details Name: Denise Steele MRN: [id number] DOB: 1955-09-19 Today's Date: 08/27/2024   History of Present Illness 69 yo female s/p R TKA on 08/26/24. PMH: arthritis, HTN, PONV, R THA 2021, L THA 2022, ankle fx-s/p ORIF,    PT Comments  Pt is making excellent progress, meeting PT goals this morning and is ready to d/c from PT standpoint. Excellent knee A/AROM ~ 6 to 70 degrees right knee flexion.  Pt spouse present for session and is able to assist as needed. RN aware.     If plan is discharge home, recommend the following: A little help with bathing/dressing/bathroom;Assistance with cooking/housework;Help with stairs or ramp for entrance;Assist for transportation   Can travel by private vehicle        Equipment Recommendations  None recommended by PT    Recommendations for Other Services       Precautions / Restrictions Precautions Precautions: Fall;Knee Recall of Precautions/Restrictions: Intact Precaution/Restrictions Comments: reviewed donning, doffing, use of KI for side  sleeping if needed, precautions/skin checks Restrictions Weight Bearing Restrictions Per Provider Order: No RLE Weight Bearing Per Provider Order: Weight bearing as tolerated     Mobility  Bed Mobility               General bed mobility comments: in recliner pre and post session    Transfers Overall transfer level: Needs assistance Equipment used: Rolling walker (2 wheels) Transfers: Sit to/from Stand Sit to Stand: Contact guard assist, Supervision           General transfer comment: cues for hand placement and RLE position    Ambulation/Gait Ambulation/Gait assistance: Supervision Gait Distance (Feet): 100 Feet Assistive device: Rolling walker (2 wheels) Gait Pattern/deviations: Step-to pattern, Step-through pattern Gait velocity: decr     General Gait Details: cues for sequence and RW position; progression to step through pattern with improved wt  shift to RLE withincr distance. no LOB   Stairs Stairs: Yes Stairs assistance: Contact guard assist, Min assist Stair Management: No rails, Step to pattern, Forwards, With walker Number of Stairs: 2 (and 1 step) General stair comments: cues for sequence and technique. spouse present and able to return demo  assist; verbally reviewed technique using bil rails if pt goes up to 2nd level; no LOB or knee instability noted   Wheelchair Mobility     Tilt Bed    Modified Rankin (Stroke Patients Only)       Balance Overall balance assessment: No apparent balance deficits (not formally assessed)                                          Communication Communication Communication: No apparent difficulties  Cognition Arousal: Alert Behavior During Therapy: WFL for tasks assessed/performed   PT - Cognitive impairments: No apparent impairments                         Following commands: Intact      Cueing Cueing Techniques: Verbal cues  Exercises Total Joint Exercises Ankle Circles/Pumps: AROM, Both, 10 reps Quad Sets: AROM, Strengthening, Both, 10 reps Heel Slides: AAROM, Right, 10 reps Hip ABduction/ADduction: AROM, AAROM, Right, 10 reps Straight Leg Raises: AROM, AAROM, Right, 10 reps    General Comments        Pertinent Vitals/Pain Pain Assessment Pain Assessment: 0-10 Pain Score: 4  Pain Location: right  knee Pain Descriptors / Indicators: Sore Pain Intervention(s): Limited activity within patient's tolerance, Monitored during session, Repositioned, Premedicated before session, Ice applied    Home Living                          Prior Function            PT Goals (current goals can now be found in the care plan section) Acute Rehab PT Goals PT Goal Formulation: With patient Time For Goal Achievement: 09/02/24 Potential to Achieve Goals: Good Progress towards PT goals: Progressing toward goals    Frequency     7X/week      PT Plan      Co-evaluation              AM-PAC PT 6 Clicks Mobility   Outcome Measure  Help needed turning from your back to your side while in a flat bed without using bedrails?: None Help needed moving from lying on your back to sitting on the side of a flat bed without using bedrails?: None Help needed moving to and from a bed to a chair (including a wheelchair)?: A Little Help needed standing up from a chair using your arms (e.g., wheelchair or bedside chair)?: A Little Help needed to walk in hospital room?: A Little Help needed climbing 3-5 steps with a railing? : A Little 6 Click Score: 20    End of Session Equipment Utilized During Treatment: Gait belt Activity Tolerance: Patient tolerated treatment well Patient left: in chair;with call bell/phone within reach;with chair alarm set;with family/visitor present Nurse Communication: Mobility status PT Visit Diagnosis: Other abnormalities of gait and mobility (R26.89);Difficulty in walking, not elsewhere classified (R26.2)     Time: 1030-1055 PT Time Calculation (min) (ACUTE ONLY): 25 min  Charges:    $Gait Training: 8-22 mins $Therapeutic Exercise: 8-22 mins PT General Charges $$ ACUTE PT VISIT: 1 Visit                     Benjamyn Hestand, PT  Acute Rehab Dept [facility]) [phone]  08/27/2024    [facility] 08/27/2024, 11:06 AM

## 2024-08-27 NOTE — Progress Notes (Signed)
 Discharge meds in a secure bag delivered to patient by this RN

## 2024-08-27 NOTE — Progress Notes (Signed)
 "  Subjective: 1 Day Post-Op Procedures (LRB): ARTHROPLASTY, KNEE, TOTAL (Right) Patient reports pain as mild.   Patient seen in rounds by Dr. Melodi. Patient is well, and has had no acute complaints or problems No issues overnight. Denies chest pain, SOB, or calf pain. Foley catheter removed this AM.  We will continue therapy today, ambulated 60' yesterday.   Objective: Vital signs in last 24 hours: Temp:  [97.2 F (36.2 C)-98 F (36.7 C)] 98 F (36.7 C) (01/27 0545) Pulse Rate:  [54-77] 67 (01/27 0545) Resp:  [13-18] 18 (01/27 0545) BP: (93-129)/(45-71) 93/45 (01/27 0545) SpO2:  [96 %-100 %] 96 % (01/27 0545) Weight:  [79.4 kg] 79.4 kg (01/26 1701)  Intake/Output from previous day:  Intake/Output Summary (Last 24 hours) at 08/27/2024 0825 Last data filed at 08/27/2024 9347 Gross per 24 hour  Intake 3425.54 ml  Output 3295 ml  Net 130.54 ml     Intake/Output this shift: No intake/output data recorded.  Labs: Recent Labs    08/27/24 0341  HGB 12.3   Recent Labs    08/27/24 0341  WBC 12.6*  RBC 4.01  HCT 38.5  PLT 211   Recent Labs    08/27/24 0341  NA 138  K 5.0  CL 106  CO2 24  BUN 12  CREATININE 0.77  GLUCOSE 123*  CALCIUM 8.7*   No results for input(s): LABPT, INR in the last 72 hours.  Exam: General - Patient is Alert and Oriented Extremity - Neurologically intact Neurovascular intact Sensation intact distally Dorsiflexion/Plantar flexion intact Dressing - dressing C/D/I Motor Function - intact, moving foot and toes well on exam.   Past Medical History:  Diagnosis Date   Arthritis    hips and knees   Asthma    Sports induced   Family history of adverse reaction to anesthesia    Sister and mother N&V and BP drops   History of kidney stones    Hypertension    Medical history non-contributory    PONV (postoperative nausea and vomiting)     Assessment/Plan: 1 Day Post-Op Procedures (LRB): ARTHROPLASTY, KNEE, TOTAL  (Right) Principal Problem:   OA (osteoarthritis) of knee Active Problems:   Primary osteoarthritis of right knee  Estimated body mass index is 29.12 kg/m as calculated from the following:   Height as of this encounter: 5' 5 (1.651 m).   Weight as of this encounter: 79.4 kg. Advance diet Up with therapy   Patient's anticipated LOS is less than 2 midnights, meeting these requirements: - Lives within 1 hour of care - Has a competent adult at home to recover with post-op recover - NO history of             - Chronic pain requiring opiods             - Diabetes             - Coronary Artery Disease             - Heart failure             - Heart attack             - Stroke             - DVT/VTE             - Cardiac arrhythmia             - Respiratory Failure/COPD             -  Renal failure             - Anemia             - Advanced Liver disease     DVT Prophylaxis - Aspirin  Weight bearing as tolerated. Continue therapy.  BP soft this AM, bolus ordered.  Plan is to go Home after hospital stay. Plan for discharge later today if progresses with therapy and meeting goals. Scheduled for OPP[facility] at [facility]). Follow-up in the office in 2 weeks.  The PDMP database was reviewed today prior to any opioid medications being prescribed to this patient.  Roxie Mess, PA-C Orthopedic Surgery [phone] 08/27/2024, 8:25 AM  "

## 2024-09-02 ENCOUNTER — Other Ambulatory Visit (HOSPITAL_COMMUNITY): Payer: Self-pay
